# Patient Record
Sex: Male | Born: 1940 | Race: White | Hispanic: No | Marital: Married | Smoking: Never smoker
Health system: Southern US, Community
[De-identification: ages and names within clinical notes are randomized; demographics above are authoritative.]

## PROBLEM LIST (undated history)

## (undated) DIAGNOSIS — N289 Disorder of kidney and ureter, unspecified: Secondary | ICD-10-CM

## (undated) DIAGNOSIS — M109 Gout, unspecified: Secondary | ICD-10-CM

## (undated) DIAGNOSIS — I10 Essential (primary) hypertension: Secondary | ICD-10-CM

---

## 2008-02-08 HISTORY — PX: VASCULAR SURGERY: SHX849

## 2016-11-08 ENCOUNTER — Ambulatory Visit (INDEPENDENT_AMBULATORY_CARE_PROVIDER_SITE_OTHER): Payer: Medicare Other

## 2016-11-08 ENCOUNTER — Ambulatory Visit (INDEPENDENT_AMBULATORY_CARE_PROVIDER_SITE_OTHER): Payer: Medicare Other | Admitting: Orthopaedic Surgery

## 2016-11-08 ENCOUNTER — Encounter (INDEPENDENT_AMBULATORY_CARE_PROVIDER_SITE_OTHER): Payer: Self-pay | Admitting: Orthopaedic Surgery

## 2016-11-08 DIAGNOSIS — M545 Low back pain: Secondary | ICD-10-CM

## 2016-11-08 DIAGNOSIS — G8929 Other chronic pain: Secondary | ICD-10-CM | POA: Diagnosis not present

## 2016-11-08 MED ORDER — PREDNISONE 10 MG (21) PO TBPK
ORAL_TABLET | ORAL | 0 refills | Status: DC
Start: 2016-11-08 — End: 2018-10-08

## 2016-11-08 NOTE — Progress Notes (Signed)
   Office Visit Note   [name]           Date of Birth: [birth year]           MRN: [id number] Visit Date: 11/08/2016              Requested by: No referring provider defined for this encounter. PCP: Patient, No Pcp Per   Assessment & Plan: Visit Diagnoses:  1. Chronic bilateral low back pain, with sciatica presence unspecified     Plan: Overall impression is lumbar radiculopathy. Prednisone taper prescribed. Patient has failed to months of conservative treatment and would recommend MRI of the lumbar spine at this point. Follow-up after the MRI. Total face to face encounter time was greater than 45 minutes and over half of this time was spent in counseling and/or coordination of care.  Follow-Up Instructions: Return in about 2 weeks (around 11/22/2016).   Orders:  Orders Placed This Encounter  Procedures  . XR Lumbar Spine 2-3 Views  . MR Lumbar Spine w/o contrast   Meds ordered this encounter  Medications  . predniSONE (STERAPRED UNI-PAK 21 TAB) 10 MG (21) TBPK tablet    Sig: Take as directed    Dispense:  21 tablet    Refill:  0      Procedures: No procedures performed   Clinical Data: No additional findings.   Subjective: Chief Complaint  Patient presents with  . Lower Back - Pain    76 year old gentleman who comes in with 2 months of left low back and leg radicular pain that has gotten temporary relief from Advil and Aleve. He states the pain radiates all the way down to his foot. Denies any groin pain. His pain is 5 of 10. Denies any bowel or bladder dysfunction    Review of Systems  Constitutional: Negative.   All other systems reviewed and are negative.    Objective: Vital Signs: There were no vitals taken for this visit.  Physical Exam  Constitutional: He is oriented to person, place, and time. He appears well-developed and well-nourished.  HENT:  Head: Normocephalic and atraumatic.  Eyes: Pupils are equal, round, and reactive to  light.  Neck: Neck supple.  Pulmonary/Chest: Effort normal.  Abdominal: Soft.  Musculoskeletal: Normal range of motion.  Neurological: He is alert and oriented to person, place, and time.  Skin: Skin is warm.  Psychiatric: He has a normal mood and affect. His behavior is normal. Judgment and thought content normal.  Nursing note and vitals reviewed.   Ortho Exam Left hip exam is benign. Mildly positive sciatic tension sign. No motor or sensory deficits. Normal reflexes. Specialty Comments:  No specialty comments available.  Imaging: Xr Lumbar Spine 2-3 Views  Result Date: 11/08/2016 Lumbar spondylosis. Hip joints appear to be preserved    PMFS History: There are no active problems to display for this patient.  No past medical history on file.  No family history on file.  No past surgical history on file. Social History   Occupational History  . Not on file.   Social History Main Topics  . Smoking status: Never Smoker  . Smokeless tobacco: Never Used  . Alcohol use Not on file  . Drug use: Unknown  . Sexual activity: Not on file

## 2016-11-17 ENCOUNTER — Telehealth (INDEPENDENT_AMBULATORY_CARE_PROVIDER_SITE_OTHER): Payer: Self-pay | Admitting: Orthopaedic Surgery

## 2016-11-17 NOTE — Telephone Encounter (Signed)
Do you know the status of referral (MRI)

## 2016-11-17 NOTE — Telephone Encounter (Signed)
Pt stated he needed to be scheduled for his MRI and he hasn't heard from anyone.  Kwali [phone]

## 2016-11-18 ENCOUNTER — Telehealth (INDEPENDENT_AMBULATORY_CARE_PROVIDER_SITE_OTHER): Payer: Self-pay | Admitting: Orthopaedic Surgery

## 2016-11-18 NOTE — Telephone Encounter (Signed)
I sw Kay at [facility] imaging and she is suppsoe to call pt to schedule appt

## 2016-11-18 NOTE — Telephone Encounter (Signed)
Patient called this morning stating that he is out of pain medication and the medication Dr. XU prescribeRoda Shuttersd for him really did not work and he is wanting to know if Dr. Roda Shutters can call him in something a little stronger.  He uses [facility] on [address].  CB#[phone].  Thank you.

## 2016-11-18 NOTE — Telephone Encounter (Signed)
Tramadol #[phone]

## 2016-11-18 NOTE — Telephone Encounter (Signed)
Please advise 

## 2016-11-21 MED ORDER — TRAMADOL HCL 50 MG PO TABS: 50.0000 mg | ORAL_TABLET | Freq: Every day | ORAL | 0 refills | Status: DC | PRN

## 2016-11-21 NOTE — Telephone Encounter (Signed)
CALLED INTO PHARM, PT AWARE.

## 2016-11-22 ENCOUNTER — Ambulatory Visit (INDEPENDENT_AMBULATORY_CARE_PROVIDER_SITE_OTHER): Payer: Medicare Other | Admitting: Orthopaedic Surgery

## 2016-11-25 ENCOUNTER — Ambulatory Visit
Admission: RE | Admit: 2016-11-25 | Discharge: 2016-11-25 | Disposition: A | Discharge status: Home / Self Care | Payer: Medicare Other | Source: Ambulatory Visit | Attending: Orthopaedic Surgery | Admitting: Orthopaedic Surgery

## 2016-11-25 DIAGNOSIS — G8929 Other chronic pain: Secondary | ICD-10-CM

## 2016-11-25 DIAGNOSIS — M545 Low back pain: Principal | ICD-10-CM

## 2016-11-28 ENCOUNTER — Ambulatory Visit (INDEPENDENT_AMBULATORY_CARE_PROVIDER_SITE_OTHER): Payer: Medicare Other | Admitting: Orthopaedic Surgery

## 2016-11-28 ENCOUNTER — Encounter (INDEPENDENT_AMBULATORY_CARE_PROVIDER_SITE_OTHER): Payer: Self-pay | Admitting: Orthopaedic Surgery

## 2016-11-28 DIAGNOSIS — M545 Low back pain: Secondary | ICD-10-CM | POA: Diagnosis not present

## 2016-11-28 DIAGNOSIS — G8929 Other chronic pain: Secondary | ICD-10-CM | POA: Diagnosis not present

## 2016-11-28 NOTE — Progress Notes (Signed)
   Office Visit Note   Patient: Eric PutnamRon Hankinson           Date of Birth: 1940-05-21           MRN: [id number] Visit Date: 11/28/2016              Requested by: No referring provider defined for this encounter. PCP: Patient, No Pcp Per   Assessment & Plan: Visit Diagnoses:  1. 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  These findings were discussed with the patient.  We will refer him to Dr. Alvester MorinNewton for epidural steroid injection.  Follow-up as needed.  Follow-Up Instructions: Return if symptoms worsen or fail to improve.   Orders:  Orders Placed This Encounter  Procedures  . Ambulatory referral to Physical Medicine Rehab   No orders of the defined types were placed in this encounter.     Procedures: No procedures performed   Clinical Data: No additional findings.   Subjective: Chief Complaint  Patient presents with  . Lower Back - Pain, Follow-up    Patient follows up today to review his MRI of his he denies any changes in medical history.    Review of Systems   Objective: Vital Signs: There were no vitals taken for this visit.  Physical Exam  Ortho Exam Exam is stable. Specialty Comments:  No specialty comments available.  Imaging: No results found.   PMFS History: There are no active problems to display for this patient.  No past medical history on file.  No family history on file.  No past surgical history on file. Social History   Occupational History  . Not on file.   Social History Main Topics  . Smoking status: Never Smoker  . Smokeless tobacco: Never Used  . Alcohol use Not on file  . Drug use: Unknown  . Sexual activity: Not on file

## 2016-12-07 ENCOUNTER — Ambulatory Visit (INDEPENDENT_AMBULATORY_CARE_PROVIDER_SITE_OTHER): Payer: Medicare Other | Admitting: Physical Medicine and Rehabilitation

## 2016-12-07 ENCOUNTER — Ambulatory Visit (INDEPENDENT_AMBULATORY_CARE_PROVIDER_SITE_OTHER): Payer: Medicare Other

## 2016-12-07 ENCOUNTER — Encounter (INDEPENDENT_AMBULATORY_CARE_PROVIDER_SITE_OTHER): Payer: Self-pay | Admitting: Physical Medicine and Rehabilitation

## 2016-12-07 VITALS — BP 166/69 | HR 61 | Temp 97.8°F

## 2016-12-07 DIAGNOSIS — M5416 Radiculopathy, lumbar region: Secondary | ICD-10-CM

## 2016-12-07 MED ORDER — BETAMETHASONE SOD PHOS & ACET 6 (3-3) MG/ML IJ SUSP
12.0000 mg | Freq: Once | INTRAMUSCULAR | Status: AC
  Administered 2016-12-07: 12 mg

## 2016-12-07 MED ORDER — LIDOCAINE HCL (PF) 1 % IJ SOLN
2.0000 mL | Freq: Once | INTRAMUSCULAR | Status: AC
  Administered 2016-12-07: 2 mL

## 2016-12-07 NOTE — Patient Instructions (Signed)

## 2016-12-07 NOTE — Progress Notes (Deleted)
Pt here for Lumbar spine injection left side. Pain radiates to front of legs but says it changes from time to time. When pt sits down pain starts, if sleep on left side pain is there. Pt is taking ibuprofen and aleve over the counter and works great. No numbness or tingling. 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

## 2016-12-07 NOTE — Procedures (Signed)
Mr. Rolley SimsHampton is a 76 year old gentleman followed by Dr. Roda ShuttersXu for his low back pain and left hip and leg pain.  Patient has failed conservative care as documented.  He is having left-sided low back pain that radiates to the front and side of the leg and more of an L4 and L5 distribution.  He has more pain with sitting than standing and walking.  He has a lot of pain if he sleeps on the left side.  He has been taking ibuprofen and Aleve and that does seem to help.  He denies any tingling or numbness.  MRI shows disc bulge broad with moderate multifactorial stenosis at L4-5 but also with left-sided disc herniation protrusion at L5-S1.  I think his symptoms are a combination of both of these issues.  We will complete a left intralaminar epidural steroid injection.  Lumbar Epidural Steroid Injection - Interlaminar Approach with Fluoroscopic Guidance  Patient: Nobie PutnamRon Mosqueda      Date of Birth: [birth year] MRN: [id number] PCP: Patient, No Pcp Per      Visit Date: 12/07/2016   Universal Protocol:     Consent Given By: the patient  Position: PRONE  Additional Comments: Vital signs were monitored before and after the procedure. Patient was prepped and draped in the usual sterile fashion. The correct patient, procedure, and site was verified.   Injection Procedure Details:  Procedure Site One Meds Administered:  Meds ordered this encounter  Medications  . lidocaine (PF) (XYLOCAINE) 1 % injection 2 mL  . betamethasone acetate-betamethasone sodium phosphate (CELESTONE) injection 12 mg     Laterality: Left  Location/Site:  L5-S1  Needle size: 20 G  Needle type: Tuohy  Needle Placement: Paramedian epidural  Findings:  -Contrast Used: 1 mL iohexol 180 mg iodine/mL   -Comments: Excellent flow of contrast into the epidural space.  Procedure Details: Using a paramedian approach from the side mentioned above, the region overlying the inferior lamina was localized under fluoroscopic visualization  and the soft tissues overlying this structure were infiltrated with 4 ml. of 1% Lidocaine without Epinephrine. The Tuohy needle was inserted into the epidural space using a paramedian approach.   The epidural space was localized using loss of resistance along with lateral and bi-planar fluoroscopic views.  After negative aspirate for air, blood, and CSF, a 2 ml. volume of Isovue-250 was injected into the epidural space and the flow of contrast was observed. Radiographs were obtained for documentation purposes.    The injectate was administered into the level noted above.   Additional Comments:  The patient tolerated the procedure well Dressing: Band-Aid    Post-procedure details: Patient was observed during the procedure. Post-procedure instructions were reviewed.  Patient left the clinic in stable condition.

## 2016-12-15 NOTE — Progress Notes (Signed)
Eric Navarro - 76 y.o. Navarro MRN [id number]  Date of birth: October 15, 1940  Office Visit Note: Visit Date: 12/07/2016 PCP: Patient, No Pcp Per Referred by: No ref. provider found  Subjective: Chief Complaint  Patient presents with  . Lower Back - Pain   HPI: Eric Navarro is a 76 year old Navarro followed by Dr. Roda ShuttersXu for his low back pain and left hip and leg pain.  Patient has failed conservative care as documented.  He is having left-sided low back pain that radiates to the front and side of the leg and more of an L4 and L5 distribution.  He has more pain with sitting than standing and walking.  He has a lot of pain if he sleeps on the left side.  He has been taking ibuprofen and Aleve and that does seem to help.  He denies any tingling or numbness.  Dr. Roda ShuttersXu obtained an MRI of the lumbar spine.  MRI shows disc bulge broad with moderate multifactorial stenosis at L4-5 but also with left-sided disc herniation protrusion at L5-S1.  The patient has had no prior lumbar epidural injections.  He has had no prior back surgery.  He has had a history of some orthopedic issues off and on.  His symptoms have been occurring over the last 3 months without any relief other than over-the-counter anti-inflammatories and those are temporary.  He has not had focused physical therapy.  He did have a prednisone taper without relief.  Medical history is uncomplicated and he really has no significant medical history 76 years old.  Eyes any focal weakness or bowel or bladder symptoms.  This hip pain is worse with side.  Prior history of bursitis.  He denies any groin pain.    Review of Systems  Constitutional: Negative for chills, fever, malaise/fatigue and weight loss.  HENT: Negative for hearing loss and sinus pain.   Eyes: Negative for blurred vision, double vision and photophobia.  Respiratory: Negative for cough and shortness of breath.   Cardiovascular: Negative for chest pain, palpitations and leg swelling.    Gastrointestinal: Negative for abdominal pain, nausea and vomiting.  Genitourinary: Negative for flank pain.  Musculoskeletal: Positive for back pain and joint pain. Negative for myalgias.  Skin: Negative for itching and rash.  Neurological: Negative for tremors, focal weakness and weakness.  Endo/Heme/Allergies: Negative.   Psychiatric/Behavioral: Negative for depression.  All other systems reviewed and are negative.  Otherwise per HPI.  Assessment & Plan: Visit Diagnoses:  1. Lumbar radiculopathy     Plan: Findings:  Chronic worsening back pain with pain pattern into the left hip and leg.  He has multiple issues from an orthopedic and spine standpoint.  I do agree with Dr. Roda ShuttersXu that the majority of his symptoms seem to be radicular and worse with sitting and likely from that small disc herniation at L5-S1.  In that regard I think it would be wise today to complete a diagnostic and hopefully therapeutic left L5-S1 interlaminar epidural steroid injection.  However he also has moderate multifactorial stenosis at L4-5 with facet arthropathy and bony narrowing.  There is no focal compression here this could also cause back pain and leg pain.  Some of his pain probably is related to the facet joints and facet joint block in the future was probably likely he would also benefit from skilled physical therapy at least a short course.  Lastly I think he is having some greater trochanteric bursitis.  He has pain with laying on the left  side.  This could be irritation of the nerve root however we do see that sometimes with pain rotation.  We went over this at length today with the patient and spent more than 20 minutes talking about natural history of disc herniations and injections.    Meds & Orders:  Meds ordered this encounter  Medications  . lidocaine (PF) (XYLOCAINE) 1 % injection 2 mL  . betamethasone acetate-betamethasone sodium phosphate (CELESTONE) injection 12 mg    Orders Placed This  Encounter  Procedures  . XR C-ARM NO REPORT  . Epidural Steroid injection    Follow-up: Return if symptoms worsen or fail to improve.   Procedures: No procedures performed  Eric Navarro followed by Dr. Roda Shutters for his low back pain and left hip and leg pain.  Patient has failed conservative care as documented.  He is having left-sided low back pain that radiates to the front and side of the leg and more of an L4 and L5 distribution.  He has more pain with sitting than standing and walking.  He has a lot of pain if he sleeps on the left side.  He has been taking ibuprofen and Aleve and that does seem to help.  He denies any tingling or numbness.  MRI shows disc bulge broad with moderate multifactorial stenosis at L4-5 but also with left-sided disc herniation protrusion at L5-S1.  I think his symptoms are a combination of both of these issues.  We will complete a left intralaminar epidural steroid injection.  Lumbar Epidural Steroid Injection - Interlaminar Approach with Fluoroscopic Guidance  Eric Navarro      Date of Birth: 1940-09-29 MRN: [id number] PCP: Patient, No Pcp Per      Visit Date: 12/07/2016   Universal Protocol:     Consent Given By: the patient  Position: PRONE  Additional Comments: Vital signs were monitored before and after the procedure. Patient was prepped and draped in the usual sterile fashion. The correct patient, procedure, and site was verified.   Injection Procedure Details:  Procedure Site One Meds Administered:  Meds ordered this encounter  Medications  . lidocaine (PF) (XYLOCAINE) 1 % injection 2 mL  . betamethasone acetate-betamethasone sodium phosphate (CELESTONE) injection 12 mg     Laterality: Left  Location/Site:  L5-S1  Needle size: 20 G  Needle type: Tuohy  Needle Placement: Paramedian epidural  Findings:  -Contrast Used: 1 mL iohexol 180 mg iodine/mL   -Comments: Excellent flow of contrast into the  epidural space.  Procedure Details: Using a paramedian approach from the side mentioned above, the region overlying the inferior lamina was localized under fluoroscopic visualization and the soft tissues overlying this structure were infiltrated with 4 ml. of 1% Lidocaine without Epinephrine. The Tuohy needle was inserted into the epidural space using a paramedian approach.   The epidural space was localized using loss of resistance along with lateral and bi-planar fluoroscopic views.  After negative aspirate for air, blood, and CSF, a 2 ml. volume of Isovue-250 was injected into the epidural space and the flow of contrast was observed. Radiographs were obtained for documentation purposes.    The injectate was administered into the level noted above.   Additional Comments:  The patient tolerated the procedure well Dressing: Band-Aid    Post-procedure details: Patient was observed during the procedure. Post-procedure instructions were reviewed.  Patient left the clinic in stable condition.    Clinical History: MRI LUMBAR SPINE WITHOUT CONTRAST  TECHNIQUE: Multiplanar, multisequence  MR imaging of the lumbar spine was performed. No intravenous contrast was administered.  COMPARISON:  Plain films lumbar spine 11/08/2016.  FINDINGS: Segmentation:  Standard.  Alignment:  Maintained.  Vertebrae: No fracture or worrisome lesion. There is some degenerative endplate signal change posteriorly at L3-4 and L4-5.  Conus medullaris: Extends to the L1 level and appears normal.  Paraspinal and other soft tissues: Negative.  Disc levels:  T11-12 and T12-L1 are imaged in the sagittal plane only. There is a shallow right paracentral protrusion at T11-12. The central canal and foramina appear open at both levels.  L1-2:  Negative.  L2-3: Small protrusion in the far periphery of the right foramen is identified but does not appear to impact the right L2 root. The central  canal and foramina are widely patent.  L3-4: Very shallow disc bulge with endplate spurring mild facet degenerative change. The central canal and foramina are open.  L4-5: Facet arthropathy, ligamentum flavum thickening and a diffuse broad-based disc bulge are identified. There is moderate central canal stenosis. Disc results in narrowing in the subarticular recesses bilaterally with encroachment on the descending L5 roots, worse on the left. The foramina are open.  L5-S1: There is a shallow left lateral recess disc protrusion which contacts and slightly deflects the descending left S1 root. Mild to moderate facet degenerative change is present. The foramina are open.  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 Bilateral subarticular recess narrowing with encroachment on the descending L5 roots at this level is worse on the left.  Small left lateral recess protrusion at L5-S1 slightly deflects the descending left S1 root.  Small protrusion in the far periphery of the right foramen at L2-3 does not appear to impact the exiting right L2 root.   Electronically Signed   By: Drusilla Kannerhomas  Dalessio M.D.   On: 11/25/2016 07:40  He reports that  has never smoked. he has never used smokeless tobacco. No results for input(s): HGBA1C, LABURIC in the last 8760 hours.  Objective:  VS:  HT:    WT:   BMI:     BP:(!) 166/69  HR:61bpm  TEMP:97.8 F (36.6 C)(Oral)  RESP:99 % Physical Exam  Constitutional: He is oriented to person, place, and time. He appears well-developed and well-nourished. No distress.  HENT:  Head: Normocephalic and atraumatic.  Eyes: Conjunctivae are normal. Pupils are equal, round, and reactive to light.  Neck: Normal range of motion. Neck supple.  Cardiovascular: Regular rhythm and intact distal pulses.  Pulmonary/Chest: Effort normal. No respiratory distress.  Musculoskeletal:   Patient ambulates without aid and does have a positive slump test on the left.  He has pain with extension of the lumbar spine and pain with palpation of the left greater trochanter.  He has no pain with hip rotation no groin pain.  He has good distal strength.  Neurological: He is alert and oriented to person, place, and time. He exhibits normal muscle tone. Coordination normal.  Skin: Skin is warm and dry. No rash noted. No erythema.  Psychiatric: He has a normal mood and affect.  Nursing note and vitals reviewed.   Ortho Exam Imaging: No results found.  Past Medical/Family/Surgical/Social History: Medications & Allergies reviewed per EMR There are no active problems to display for this patient.  No past medical history on file. No family history on file. No past surgical history on file. Social History   Occupational History  . Not on file  Tobacco  Use  . Smoking status: Never Smoker  . Smokeless tobacco: Never Used  Substance and Sexual Activity  . Alcohol use: Not on file  . Drug use: Not on file  . Sexual activity: Not on file

## 2017-02-16 DIAGNOSIS — I10 Essential (primary) hypertension: Secondary | ICD-10-CM | POA: Insufficient documentation

## 2017-02-16 DIAGNOSIS — Z9109 Other allergy status, other than to drugs and biological substances: Secondary | ICD-10-CM | POA: Insufficient documentation

## 2017-02-16 DIAGNOSIS — N400 Enlarged prostate without lower urinary tract symptoms: Secondary | ICD-10-CM | POA: Insufficient documentation

## 2018-10-08 ENCOUNTER — Other Ambulatory Visit: Payer: Self-pay

## 2018-10-08 ENCOUNTER — Encounter (HOSPITAL_COMMUNITY): Payer: Self-pay | Admitting: Emergency Medicine

## 2018-10-08 ENCOUNTER — Ambulatory Visit (HOSPITAL_COMMUNITY)
Admission: EM | Admit: 2018-10-08 | Discharge: 2018-10-08 | Disposition: A | Discharge status: Home / Self Care | Payer: Medicare HMO | Attending: Family Medicine | Admitting: Family Medicine

## 2018-10-08 DIAGNOSIS — Z20828 Contact with and (suspected) exposure to other viral communicable diseases: Secondary | ICD-10-CM | POA: Insufficient documentation

## 2018-10-08 DIAGNOSIS — R42 Dizziness and giddiness: Secondary | ICD-10-CM

## 2018-10-08 DIAGNOSIS — I10 Essential (primary) hypertension: Secondary | ICD-10-CM | POA: Diagnosis not present

## 2018-10-08 DIAGNOSIS — R112 Nausea with vomiting, unspecified: Secondary | ICD-10-CM

## 2018-10-08 DIAGNOSIS — Z79899 Other long term (current) drug therapy: Secondary | ICD-10-CM | POA: Insufficient documentation

## 2018-10-08 HISTORY — DX: Essential (primary) hypertension: I10

## 2018-10-08 MED ORDER — ONDANSETRON 4 MG PO TBDP: 4.0000 mg | ORAL_TABLET | Freq: Three times a day (TID) | ORAL | 0 refills | Status: DC | PRN

## 2018-10-08 NOTE — ED Triage Notes (Signed)
Pt sts vomiting and not feeling well with some dizziness starting today; pt sts his wife was tested for covid here today

## 2018-10-08 NOTE — Discharge Instructions (Signed)
COVID pending  Please use zofran as needed for nausea and vomiting- dissolves under tongue Drink plenty of fluids Slowly transition to bland diet Do not make sudden movements  If developing worsening symptoms, abdominal pain, worsening dizziness, unable to keep food/drink down follow up in emergency room

## 2018-10-09 NOTE — ED Provider Notes (Signed)
[facility]    CSN: [id number] Arrival date & time: 10/08/18  1929      History   Chief Complaint Chief Complaint  Patient presents with  . Emesis    HPI Eric Navarro is a 78 y.o. male history of hypertension presenting today for evaluation of nausea, vomiting and dizziness.  Patient states that this morning when he woke up he developed some nausea and vomiting.  He has had some slight dizziness/lightheadedness when changing position and standing up quickly.  Symptoms began today.  Denies any fevers.  Patient states that his daughter and wife recently were here to for COVID testing and their tests are pending.  He has no known exposure to COVID.  Denies any URI symptoms of cough, congestion, sore throat.  Denies abdominal pain.  Denies change in bowel movements and diarrhea.  Denies any chest pain or shortness of breath.  Denies any change in medicines.  Has had decreased oral intake beginning today.  HPI  Past Medical History:  Diagnosis Date  . Hypertension     There are no active problems to display for this patient.   History reviewed. No pertinent surgical history.     Home Medications    Prior to Admission medications   Medication Sig Start Date End Date Taking? Authorizing Provider  DILT-XR 180 MG 24 hr capsule TK 1 C PO D 08/31/16   [provider]  lisinopril (PRINIVIL,ZESTRIL) 10 MG tablet TK 1 T PO D FOR BP 08/30/16   [provider]  ondansetron (ZOFRAN ODT) 4 MG disintegrating tablet Take 1 tablet (4 mg total) by mouth every 8 (eight) hours as needed for nausea or vomiting. 10/08/18   ,  C, PA-C  tamsulosin (FLOMAX) 0.4 MG CAPS capsule TK 1 C PO D 08/30/16   [provider]    Family History History reviewed. No pertinent family history.  Social History Social History   Tobacco Use  . Smoking status: Never Smoker  . Smokeless tobacco: Never Used  Substance Use Topics  . Alcohol use: Not Currently  . Drug  use: Never     Allergies   Patient has no known allergies.   Review of Systems Review of Systems  Constitutional: Negative for activity change, appetite change, chills, fatigue and fever.  HENT: Negative for congestion, ear pain, rhinorrhea, sinus pressure, sore throat and trouble swallowing.   Eyes: Negative for discharge and redness.  Respiratory: Negative for cough, chest tightness and shortness of breath.   Cardiovascular: Negative for chest pain.  Gastrointestinal: Positive for nausea and vomiting. Negative for abdominal pain and diarrhea.  Musculoskeletal: Negative for myalgias.  Skin: Negative for rash.  Neurological: Positive for dizziness and light-headedness. Negative for headaches.     Physical Exam Triage Vital Signs ED Triage Vitals  Enc Vitals Group     BP 10/08/18 1954 (!) 145/70     Pulse Rate 10/08/18 1954 92     Resp 10/08/18 1954 18     Temp 10/08/18 1954 99 F (37.2 C)     Temp Source 10/08/18 1954 Oral     SpO2 10/08/18 1954 98 %     Weight --      Height --      Head Circumference --      Peak Flow --      Pain Score 10/08/18 1955 5     Pain Loc --      Pain Edu? --      Excl. in  GC? --    No data found.  Updated Vital Signs BP (!) 145/70 (BP Location: Right Arm)   Pulse 92   Temp 99 F (37.2 C) (Oral)   Resp 18   SpO2 98%   Visual Acuity Right Eye Distance:   Left Eye Distance:   Bilateral Distance:    Right Eye Near:   Left Eye Near:    Bilateral Near:     Physical Exam Vitals signs and nursing note reviewed.  Constitutional:      Appearance: He is well-developed.  HENT:     Head: Normocephalic and atraumatic.     Ears:     Comments: Bilateral cerumen impaction, difficult to visualize TMs    Mouth/Throat:     Comments: Oral mucosa pink and moist, no tonsillar enlargement or exudate. Posterior pharynx patent and nonerythematous, no uvula deviation or swelling. Normal phonation.  Eyes:     Extraocular Movements:  Extraocular movements intact.     Conjunctiva/sclera: Conjunctivae normal.     Pupils: Pupils are equal, round, and reactive to light.  Neck:     Musculoskeletal: Neck supple.  Cardiovascular:     Rate and Rhythm: Normal rate and regular rhythm.     Heart sounds: No murmur.  Pulmonary:     Effort: Pulmonary effort is normal. No respiratory distress.     Breath sounds: Normal breath sounds.     Comments: Breathing comfortably at rest, CTABL, no wheezing, rales or other adventitious sounds auscultated  Abdominal:     Palpations: Abdomen is soft.     Tenderness: There is no abdominal tenderness.     Comments: Soft, nontender to light deep palpation throughout entire abdomen  Skin:    General: Skin is warm and dry.  Neurological:     General: No focal deficit present.     Mental Status: He is alert and oriented to person, place, and time.     Cranial Nerves: No cranial nerve deficit.     Gait: Gait normal.      UC Treatments / Results  Labs (all labs ordered are listed, but only abnormal results are displayed) Labs Reviewed  NOVEL CORONAVIRUS, NAA (HOSP ORDER, SEND-OUT TO REF LAB; TAT 18-24 HRS)    EKG   Radiology No results found.  Procedures Procedures (including critical care time)  Medications Ordered in UC Medications - No data to display  Initial Impression / Assessment and Plan / UC Course  I have reviewed the triage vital signs and the nursing notes.  Pertinent labs & imaging results that were available during my care of the patient were reviewed by me and considered in my medical decision making (see chart for details).     Patient with 1 day of nausea and vomiting without abdominal pain.  Vital signs stable.  Do not suspect underlying abdominal emergency at this time.  Has had some dizziness with changing position, likely from mild dehydration.  Will provide Zofran to use as needed for nausea and vomiting, trial of oral rehydration, push fluids.  COVID swab  pending, currently without URI symptoms.Discussed strict return precautions. Patient verbalized understanding and is agreeable with plan.  Final Clinical Impressions(s) / UC Diagnoses   Final diagnoses:  Nausea and vomiting, intractability of vomiting not specified, unspecified vomiting type  Dizziness     Discharge Instructions     COVID pending  Please use zofran as needed for nausea and vomiting- dissolves under tongue Drink plenty of fluids Slowly transition to bland diet  Do not make sudden movements  If developing worsening symptoms, abdominal pain, worsening dizziness, unable to keep food/drink down follow up in emergency room   ED Prescriptions    Medication Sig Dispense Auth. Provider   ondansetron (ZOFRAN ODT) 4 MG disintegrating tablet Take 1 tablet (4 mg total) by mouth every 8 (eight) hours as needed for nausea or vomiting. 20 tablet , [place] C, PA-C     Controlled Substance Prescriptions [place] Controlled Substance Registry consulted? Not Applicable   Janith Lima, [place] 10/09/18 5825

## 2018-10-10 LAB — NOVEL CORONAVIRUS, NAA (HOSP ORDER, SEND-OUT TO REF LAB; TAT 18-24 HRS): SARS-CoV-2, NAA: NOT DETECTED

## 2018-10-11 ENCOUNTER — Encounter (HOSPITAL_COMMUNITY): Payer: Self-pay

## 2018-12-28 ENCOUNTER — Encounter (HOSPITAL_COMMUNITY): Payer: Self-pay

## 2018-12-28 ENCOUNTER — Other Ambulatory Visit: Payer: Self-pay

## 2018-12-28 ENCOUNTER — Ambulatory Visit (HOSPITAL_COMMUNITY)
Admission: EM | Admit: 2018-12-28 | Discharge: 2018-12-28 | Disposition: A | Discharge status: Home / Self Care | Payer: Medicare HMO | Attending: Emergency Medicine | Admitting: Emergency Medicine

## 2018-12-28 DIAGNOSIS — M109 Gout, unspecified: Secondary | ICD-10-CM

## 2018-12-28 HISTORY — DX: Gout, unspecified: M10.9

## 2018-12-28 MED ORDER — PREDNISONE 10 MG (21) PO TBPK: ORAL_TABLET | Freq: Every day | ORAL | 0 refills | Status: DC

## 2018-12-28 NOTE — ED Provider Notes (Signed)
[facility]    CSN: [id number] Arrival date & time: 12/28/18  0915      History   Chief Complaint Chief Complaint  Patient presents with  . Gout    HPI Eric Navarro is a 78 y.o. male.   Eric Navarro presents with complaints of pain and swelling to left foot big toe. Started approximately 4 days ago, no known injury. He is on his feet a lot. Has applied ice which does help some. Hasn't taken any medications for pain. Pain even at rest, kept him up last night. No fevers. He drinks wine occasionally, drinks water regularly. No other obvious diet change recently. Has had gout before but it was many years ago and he doesn't recall if this felt similar. Also states that skin to left lower leg is itchy. Has been applying some lotion which hasn't necessarily helped. History  Of htn.    ROS per HPI, negative if not otherwise mentioned.      Past Medical History:  Diagnosis Date  . Gout   . Hypertension     There are no active problems to display for this patient.   History reviewed. No pertinent surgical history.     Home Medications    Prior to Admission medications   Medication Sig Start Date End Date Taking? Authorizing Provider  DILT-XR 180 MG 24 hr capsule TK 1 C PO D 08/31/16   [provider]  lisinopril (PRINIVIL,ZESTRIL) 10 MG tablet TK 1 T PO D FOR BP 08/30/16   [provider]  ondansetron (ZOFRAN ODT) 4 MG disintegrating tablet Take 1 tablet (4 mg total) by mouth every 8 (eight) hours as needed for nausea or vomiting. 10/08/18   Wieters, Hallie C, PA-C  predniSONE (STERAPRED UNI-PAK 21 TAB) 10 MG (21) TBPK tablet Take by mouth daily. Per box instruction 12/28/18   Zigmund Gottron, NP  tamsulosin (FLOMAX) 0.4 MG CAPS capsule TK 1 C PO D 08/30/16   [provider]    Family History Family History  Family history unknown: Yes    Social History Social History   Tobacco Use  . Smoking status: Never Smoker  . Smokeless  tobacco: Never Used  Substance Use Topics  . Alcohol use: Not Currently  . Drug use: Never     Allergies   Patient has no known allergies.   Review of Systems Review of Systems   Physical Exam Triage Vital Signs ED Triage Vitals  Enc Vitals Group     BP 12/28/18 1025 (!) 141/76     Pulse Rate 12/28/18 1025 60     Resp 12/28/18 1025 17     Temp 12/28/18 1025 97.9 F (36.6 C)     Temp Source 12/28/18 1025 Oral     SpO2 12/28/18 1025 100 %     Weight --      Height --      Head Circumference --      Peak Flow --      Pain Score 12/28/18 1026 7     Pain Loc --      Pain Edu? --      Excl. in [place]? --    No data found.  Updated Vital Signs BP (!) 141/76 (BP Location: Right Arm)   Pulse 60   Temp 97.9 F (36.6 C) (Oral)   Resp 17   SpO2 100%    Physical Exam Constitutional:      Appearance: He is well-developed.  Cardiovascular:  Rate and Rhythm: Normal rate.  Pulmonary:     Effort: Pulmonary effort is normal.  Musculoskeletal:     Left foot: Tenderness, bony tenderness and swelling present.     Comments: Left foot great toe MCP joint with redness, swelling and tenderness on palpation; cap refill < 2 seconds ; left lower leg with some dry skin with irritation noted from patient scratching at the skin without any obvious rash visible    Skin:    General: Skin is warm and dry.  Neurological:     Mental Status: He is alert and oriented to person, place, and time.      UC Treatments / Results  Labs (all labs ordered are listed, but only abnormal results are displayed) Labs Reviewed - No data to display  EKG   Radiology No results found.  Procedures Procedures (including critical care time)  Medications Ordered in UC Medications - No data to display  Initial Impression / Assessment and Plan / UC Course  I have reviewed the triage vital signs and the nursing notes.  Pertinent labs & imaging results that were available during my care of the  patient were reviewed by me and considered in my medical decision making (see chart for details).     Findings are consistent with gout at this time, with prednisone provided. No obvious rash to lower leg, dry skin treatment recommendations discussed and provided. Encouraged establish and follow with pcp for bp recheck. Patient verbalized understanding and agreeable to plan.  Ambulatory out of clinic without difficulty.    Final Clinical Impressions(s) / UC Diagnoses   Final diagnoses:  Acute gout involving toe of left foot, unspecified cause     Discharge Instructions     Ice, elevation to help with pain.  Prednisone pack as prescribed to help with gout flare.  See diet recommendations for prevention of flare, drink plenty of water.  Use of a thick cream to lower leg dryness, such as Aquaphor may be helpful.  Please establish with a primary care provider for recheck and management as needed.    ED Prescriptions    Medication Sig Dispense Auth. Provider   predniSONE (STERAPRED UNI-PAK 21 TAB) 10 MG (21) TBPK tablet Take by mouth daily. Per box instruction 21 tablet Georgetta Haber, NP     PDMP not reviewed this encounter.   Georgetta Haber, NP 12/28/18 1142

## 2018-12-28 NOTE — ED Triage Notes (Signed)
Pt states he is having a recurring gout flare up in left foot.

## 2018-12-28 NOTE — Discharge Instructions (Signed)
Ice, elevation to help with pain.  Prednisone pack as prescribed to help with gout flare.  See diet recommendations for prevention of flare, drink plenty of water.  Use of a thick cream to lower leg dryness, such as Aquaphor may be helpful.  Please establish with a primary care provider for recheck and management as needed.

## 2019-02-18 ENCOUNTER — Ambulatory Visit: Payer: Medicare Other | Attending: Internal Medicine

## 2019-02-18 DIAGNOSIS — Z23 Encounter for immunization: Secondary | ICD-10-CM | POA: Insufficient documentation

## 2019-02-18 NOTE — Progress Notes (Signed)
   Covid-19 Vaccination Clinic  Name:  Eric Navarro    MRN: [id number] DOB: 1940-04-10  02/18/2019  Eric Navarro was observed post Covid-19 immunization for 15 minutes without incidence. He was provided with Vaccine Information Sheet and instruction to access the V-Safe system.   Eric Navarro was instructed to call 911 with any severe reactions post vaccine: Marland Kitchen Difficulty breathing  . Swelling of your face and throat  . A fast heartbeat  . A bad rash all over your body  . Dizziness and weakness    Immunizations Administered    Name Date Dose VIS Date Route   Pfizer COVID-19 Vaccine 02/18/2019  8:56 AM 0.3 mL 01/18/2019 Intramuscular   Manufacturer: [employer], [employer]   Lot: [id number]   NDC: [address]-3

## 2019-03-10 ENCOUNTER — Ambulatory Visit: Payer: Medicare HMO | Attending: Internal Medicine

## 2019-03-10 DIAGNOSIS — Z23 Encounter for immunization: Secondary | ICD-10-CM | POA: Insufficient documentation

## 2019-03-10 NOTE — Progress Notes (Signed)
   Covid-19 Vaccination Clinic  Name:  Eric Navarro    MRN: [id number] DOB: 02-Jan-1941  03/10/2019  [name] was observed post Covid-19 immunization for 15 minutes without incidence. He was provided with Vaccine Information Sheet and instruction to access the V-Safe system.   [name] was instructed to call 911 with any severe reactions post vaccine: Marland Kitchen Difficulty breathing  . Swelling of your face and throat  . A fast heartbeat  . A bad rash all over your body  . Dizziness and weakness    Immunizations Administered    Name Date Dose VIS Date Route   Pfizer COVID-19 Vaccine 03/10/2019  9:12 AM 0.3 mL 01/18/2019 Intramuscular   Manufacturer: [employer], [employer]   Lot: [id number]   NDC: [address]-9

## 2019-08-05 ENCOUNTER — Other Ambulatory Visit: Payer: Self-pay

## 2019-08-05 ENCOUNTER — Ambulatory Visit (HOSPITAL_COMMUNITY)
Admission: EM | Admit: 2019-08-05 | Discharge: 2019-08-05 | Disposition: A | Discharge status: Home / Self Care | Payer: Medicare HMO | Attending: Physician Assistant | Admitting: Physician Assistant

## 2019-08-05 ENCOUNTER — Encounter (HOSPITAL_COMMUNITY): Payer: Self-pay | Admitting: Emergency Medicine

## 2019-08-05 DIAGNOSIS — R1032 Left lower quadrant pain: Secondary | ICD-10-CM

## 2019-08-05 LAB — POCT URINALYSIS DIP (DEVICE)
Bilirubin Urine: NEGATIVE
Glucose, UA: NEGATIVE mg/dL
Hgb urine dipstick: NEGATIVE
Ketones, ur: NEGATIVE mg/dL
Nitrite: NEGATIVE
Protein, ur: NEGATIVE mg/dL
Specific Gravity, Urine: >=1.03 (ref 1.005–1.030)
Urobilinogen, UA: 0.2 mg/dL (ref 0.0–1.0)
pH: 5 (ref 5.0–8.0)

## 2019-08-05 NOTE — Discharge Instructions (Addendum)
We believe this may be related to veins in your spermatic chord have some varicosity.  The urinalysis showed some white blood cells, we will send a culture to decided on any treatments  Wear supportive underwear while up and about  Follow up with your PCP   If not improving, I have supplied a urology follow up option as well   If you were to have any worsening light headedness and feel like you might pass out, go to the Emergency department

## 2019-08-05 NOTE — ED Provider Notes (Signed)
[facility]    CSN: [id number] Arrival date & time: 08/05/19  1653      History   Chief Complaint Chief Complaint  Patient presents with  . Groin Pain    HPI Eric Navarro is a 79 y.o. male.   Patient reports urgent care for left-sided groin pain.  He reports pain has been present for the last 3 days.  He reports started with a twinge when he is walking his dog.  Since then pain is increased to a stronger pain sensation.  He reports is primarily there when standing and walking.  He reports this feels like a pulling aching sensation.  Starts in his lower abdomen and radiates down to the left aspect of his scrotum.  He denies pain when seated or resting.  Denies any scrotal swelling.  Denies any rashes.  Moving his bowels normally without diarrhea or constipation.  No painful urination, frequency or urgency.  He does report having episode of lightheaded feeling last week that went away quickly.  He reports an episode today when he experienced the pain while walking.  He reports this also went away after he sat down and rested has not had this since.  Denies lightheaded feeling clinic.  Denies chest pain or shortness of breath with any of these episodes.  At no point during these episodes that he feel he was going to pass out.  He reports he has been drinking water and eating normally.  Is not any nausea or vomiting.     Past Medical History:  Diagnosis Date  . Gout   . Hypertension     There are no problems to display for this patient.   History reviewed. No pertinent surgical history.     Home Medications    Prior to Admission medications   Medication Sig Start Date End Date Taking? Authorizing Provider  diclofenac (VOLTAREN) 75 MG EC tablet Take 75 mg by mouth 2 (two) times daily. 07/26/19  Yes [provider]  lisinopril (PRINIVIL,ZESTRIL) 10 MG tablet TK 1 T PO D FOR BP 08/30/16  Yes [provider]  montelukast (SINGULAIR) 10 MG tablet Take by  mouth. 07/26/19  Yes [provider]  tamsulosin (FLOMAX) 0.4 MG CAPS capsule TK 1 C PO D 08/30/16  Yes [provider]  colchicine 0.6 MG tablet Take by mouth as directed. 04/30/19   [provider]  DILT-XR 180 MG 24 hr capsule TK 1 C PO D 08/31/16   [provider]  ondansetron (ZOFRAN ODT) 4 MG disintegrating tablet Take 1 tablet (4 mg total) by mouth every 8 (eight) hours as needed for nausea or vomiting. 10/08/18   Wieters, Hallie C, PA-C  predniSONE (STERAPRED UNI-PAK 21 TAB) 10 MG (21) TBPK tablet Take by mouth daily. Per box instruction 12/28/18   Georgetta Haber, NP    Family History Family History  Family history unknown: Yes    Social History Social History   Tobacco Use  . Smoking status: Never Smoker  . Smokeless tobacco: Never Used  Substance Use Topics  . Alcohol use: Not Currently  . Drug use: Never     Allergies   [place] nut (berthollefia excelsa) skin test   Review of Systems Review of Systems   Physical Exam Triage Vital Signs ED Triage Vitals [08/05/19 1815]  Enc Vitals Group     BP      Pulse      Resp      Temp  Temp src      SpO2      Weight      Height      Head Circumference      Peak Flow      Pain Score 6     Pain Loc      Pain Edu?      Excl. in [place]?    No data found.  Updated Vital Signs BP (S) (!) 157/76 (BP Location: Left Arm) Comment: re-eval  Pulse (!) 53   Temp 97.9 F (36.6 C) (Oral)   Resp 18   SpO2 97%   Visual Acuity Right Eye Distance:   Left Eye Distance:   Bilateral Distance:    Right Eye Near:   Left Eye Near:    Bilateral Near:     Physical Exam Vitals and nursing note reviewed.  Constitutional:      General: He is not in acute distress.    Appearance: Normal appearance. He is well-developed. He is not ill-appearing.  HENT:     Head: Normocephalic and atraumatic.  Eyes:     Conjunctiva/sclera: Conjunctivae normal.  Cardiovascular:     Rate and Rhythm: Normal  rate and regular rhythm.     Heart sounds: No murmur heard.   Pulmonary:     Effort: Pulmonary effort is normal. No respiratory distress.     Breath sounds: Normal breath sounds.  Abdominal:     Palpations: Abdomen is soft.     Tenderness: There is no abdominal tenderness.  Genitourinary:    Comments: Testicles with equal lie and scrotum.  No tenderness, masses or swelling of the scrotum or testicles.  Cremasteric reflex intact.  There is a mild firmness and swelling in the proximal spermatic cord.  No appreciable hernia and canal.  No inguinal hernia.  No inguinal lymphadenopathy.  There is no inguinal swelling. Musculoskeletal:     Cervical back: Neck supple.     Right lower leg: No edema.     Left lower leg: No edema.  Skin:    General: Skin is warm and dry.  Neurological:     Mental Status: He is alert.      UC Treatments / Results  Labs (all labs ordered are listed, but only abnormal results are displayed) Labs Reviewed  POCT URINALYSIS DIP (DEVICE) - Abnormal; Notable for the following components:      Result Value   Leukocytes,Ua SMALL (*)    All other components within normal limits  URINE CULTURE    EKG   Radiology No results found.  Procedures Procedures (including critical care time)  Medications Ordered in UC Medications - No data to display  Initial Impression / Assessment and Plan / UC Course  I have reviewed the triage vital signs and the nursing notes.  Pertinent labs & imaging results that were available during my care of the patient were reviewed by me and considered in my medical decision making (see chart for details).     #Left inguinal pain Patient is a 79 year old gentleman with left inguinal pain.  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  Also possible reducible hernia not felt on exam.  You obtained as patient does have a history of renal stone, no blood with small leuks.  Culture sent, low suspicion for infection.   Discussed with patient that wearing of supportive underwear should aid in symptoms.  Discussed that he should follow with his primary care, I also supplied a urology follow-up with this is becoming  more bothersome.  Emergency department precautions were discussed.  Patient verbalized understanding plan of care. -Case discussed with supervising physician Dr. Lanny Cramp Final Clinical Impressions(s) / UC Diagnoses   Final diagnoses:  Left inguinal pain     Discharge Instructions     We believe this may be related to veins in your spermatic chord have some varicosity.  The urinalysis showed some white blood cells, we will send a culture to decided on any treatments  Wear supportive underwear while up and about  Follow up with your PCP   If not improving, I have supplied a urology follow up option as well   If you were to have any worsening light headedness and feel like you might pass out, go to the Emergency department       ED Prescriptions    None     PDMP not reviewed this encounter.   Purnell Shoemaker, PA-C 08/06/19 [phone]

## 2019-08-05 NOTE — ED Triage Notes (Signed)
Pt c/o of left groin pain after walking dog on Friday. Pain radiates down to testicular area on left side only while walking dog on Friday. States no pain at sitting or at rest. Dizziness began on Friday while walking.   Pt complains of no NVD.  Fluid intake good. No c/o chest pain, SOB or weakness. No dysuria symptoms. No c/o of dizziness at moment. 1 week ago pt had ear lavage.

## 2019-08-07 LAB — URINE CULTURE: Culture: <10000 — AB

## 2020-01-30 ENCOUNTER — Other Ambulatory Visit: Payer: Medicare HMO

## 2020-01-30 DIAGNOSIS — Z20822 Contact with and (suspected) exposure to covid-19: Secondary | ICD-10-CM

## 2020-02-02 LAB — NOVEL CORONAVIRUS, NAA: SARS-CoV-2, NAA: NOT DETECTED

## 2020-06-03 ENCOUNTER — Encounter (HOSPITAL_BASED_OUTPATIENT_CLINIC_OR_DEPARTMENT_OTHER): Payer: Self-pay | Admitting: Emergency Medicine

## 2020-06-03 ENCOUNTER — Emergency Department (HOSPITAL_BASED_OUTPATIENT_CLINIC_OR_DEPARTMENT_OTHER)
Admission: EM | Admit: 2020-06-03 | Discharge: 2020-06-03 | Disposition: A | Discharge status: Home / Self Care | Payer: Medicare HMO | Attending: Emergency Medicine | Admitting: Emergency Medicine

## 2020-06-03 ENCOUNTER — Other Ambulatory Visit: Payer: Self-pay

## 2020-06-03 ENCOUNTER — Emergency Department (HOSPITAL_BASED_OUTPATIENT_CLINIC_OR_DEPARTMENT_OTHER): Payer: Medicare HMO

## 2020-06-03 DIAGNOSIS — I10 Essential (primary) hypertension: Secondary | ICD-10-CM | POA: Insufficient documentation

## 2020-06-03 DIAGNOSIS — Z20822 Contact with and (suspected) exposure to covid-19: Secondary | ICD-10-CM | POA: Insufficient documentation

## 2020-06-03 DIAGNOSIS — R112 Nausea with vomiting, unspecified: Secondary | ICD-10-CM

## 2020-06-03 DIAGNOSIS — R001 Bradycardia, unspecified: Secondary | ICD-10-CM | POA: Diagnosis not present

## 2020-06-03 DIAGNOSIS — Z79899 Other long term (current) drug therapy: Secondary | ICD-10-CM | POA: Insufficient documentation

## 2020-06-03 DIAGNOSIS — N200 Calculus of kidney: Secondary | ICD-10-CM | POA: Insufficient documentation

## 2020-06-03 DIAGNOSIS — R1031 Right lower quadrant pain: Secondary | ICD-10-CM

## 2020-06-03 HISTORY — DX: Disorder of kidney and ureter, unspecified: N28.9

## 2020-06-03 LAB — URINALYSIS, ROUTINE W REFLEX MICROSCOPIC
Bilirubin Urine: NEGATIVE
Glucose, UA: NEGATIVE mg/dL
Hgb urine dipstick: NEGATIVE
Ketones, ur: NEGATIVE mg/dL
Nitrite: NEGATIVE
Protein, ur: NEGATIVE mg/dL
Specific Gravity, Urine: 1.027 (ref 1.005–1.030)
pH: 5 (ref 5.0–8.0)

## 2020-06-03 LAB — CBC WITH DIFFERENTIAL/PLATELET
Abs Immature Granulocytes: 0.02 10*3/uL (ref 0.00–0.07)
Basophils Absolute: 0 10*3/uL (ref 0.0–0.1)
Basophils Relative: 0 %
Eosinophils Absolute: 0 10*3/uL (ref 0.0–0.5)
Eosinophils Relative: 0 %
HCT: 44.3 % (ref 39.0–52.0)
Hemoglobin: 15.3 g/dL (ref 13.0–17.0)
Immature Granulocytes: 0 %
Lymphocytes Relative: 11 %
Lymphs Abs: 1.1 10*3/uL (ref 0.7–4.0)
MCH: 30.5 pg (ref 26.0–34.0)
MCHC: 34.5 g/dL (ref 30.0–36.0)
MCV: 88.4 fL (ref 80.0–100.0)
Monocytes Absolute: 0.4 10*3/uL (ref 0.1–1.0)
Monocytes Relative: 4 %
Neutro Abs: 8.3 10*3/uL — ABNORMAL HIGH (ref 1.7–7.7)
Neutrophils Relative %: 85 %
Platelets: 196 10*3/uL (ref 150–400)
RBC: 5.01 MIL/uL (ref 4.22–5.81)
RDW: 12.9 % (ref 11.5–15.5)
WBC: 9.8 10*3/uL (ref 4.0–10.5)
nRBC: 0 % (ref 0.0–0.2)

## 2020-06-03 LAB — COMPREHENSIVE METABOLIC PANEL
ALT: 18 U/L (ref 0–44)
AST: 18 U/L (ref 15–41)
Albumin: 4.5 g/dL (ref 3.5–5.0)
Alkaline Phosphatase: 53 U/L (ref 38–126)
Anion gap: 11 (ref 5–15)
BUN: 27 mg/dL — ABNORMAL HIGH (ref 8–23)
CO2: 22 mmol/L (ref 22–32)
Calcium: 9.9 mg/dL (ref 8.9–10.3)
Chloride: 104 mmol/L (ref 98–111)
Creatinine, Ser: 1.37 mg/dL — ABNORMAL HIGH (ref 0.61–1.24)
GFR, Estimated: 53 mL/min — ABNORMAL LOW (ref >60)
Glucose, Bld: 148 mg/dL — ABNORMAL HIGH (ref 70–99)
Potassium: 4.5 mmol/L (ref 3.5–5.1)
Sodium: 137 mmol/L (ref 135–145)
Total Bilirubin: 0.5 mg/dL (ref 0.3–1.2)
Total Protein: 7.4 g/dL (ref 6.5–8.1)

## 2020-06-03 LAB — RESP PANEL BY RT-PCR (FLU A&B, COVID) ARPGX2
Influenza A by PCR: NEGATIVE
Influenza B by PCR: NEGATIVE
SARS Coronavirus 2 by RT PCR: NEGATIVE

## 2020-06-03 LAB — TSH: TSH: 4.992 u[IU]/mL — ABNORMAL HIGH (ref 0.350–4.500)

## 2020-06-03 LAB — LIPASE, BLOOD: Lipase: 24 U/L (ref 11–51)

## 2020-06-03 LAB — MAGNESIUM: Magnesium: 1.9 mg/dL (ref 1.7–2.4)

## 2020-06-03 LAB — LACTIC ACID, PLASMA: Lactic Acid, Venous: 2.5 mmol/L (ref 0.5–1.9)

## 2020-06-03 MED ORDER — MORPHINE SULFATE (PF) 4 MG/ML IV SOLN
4.0000 mg | Freq: Once | INTRAVENOUS | Status: AC
  Administered 2020-06-03: 4 mg via INTRAVENOUS
  Filled 2020-06-03: qty 1

## 2020-06-03 MED ORDER — IOHEXOL 300 MG/ML  SOLN
80.0000 mL | Freq: Once | INTRAMUSCULAR | Status: AC | PRN
  Administered 2020-06-03: 80 mL via INTRAVENOUS

## 2020-06-03 MED ORDER — SODIUM CHLORIDE 0.9 % IV BOLUS
1000.0000 mL | Freq: Once | INTRAVENOUS | Status: AC
  Administered 2020-06-03: 1000 mL via INTRAVENOUS

## 2020-06-03 MED ORDER — ONDANSETRON HCL 4 MG PO TABS
4.0000 mg | ORAL_TABLET | Freq: Three times a day (TID) | ORAL | 0 refills | Status: DC | PRN
Start: 2020-06-03 — End: 2020-08-13

## 2020-06-03 MED ORDER — OXYCODONE-ACETAMINOPHEN 5-325 MG PO TABS: 1.0000 | ORAL_TABLET | ORAL | 0 refills | Status: DC | PRN

## 2020-06-03 MED ORDER — ONDANSETRON HCL 4 MG/2ML IJ SOLN
4.0000 mg | Freq: Once | INTRAMUSCULAR | Status: AC
  Administered 2020-06-03: 4 mg via INTRAVENOUS
  Filled 2020-06-03: qty 2

## 2020-06-03 NOTE — ED Triage Notes (Addendum)
Sudden onset rt sided abd pain had some vomiting no distention  Some tenderness

## 2020-06-03 NOTE — Discharge Instructions (Signed)
Your work-up today confirmed a kidney stone is the likely cause of the right-sided abdominal discomfort.  The urinalysis did not show convincing evidence of infection however a culture was sent.  If it does show infection, you will be called.  Please follow-up with your urologist for further management of the kidney stone   If symptoms persist.  Please follow-up with your primary doctor for further evaluation of that abnormal finding near your stomach on the CT scan as well as the thyroid level with your slow heart rate.  Please rest and stay hydrated.  If any symptoms change or worsen acutely, please return to the nearest emergency department.

## 2020-06-03 NOTE — ED Notes (Signed)
CRITICAL VALUE STICKER  CRITICAL VALUE:Lactic acid 2.4  RECEIVER (on-site recipient of call):Carmon Ginsberg, RN  DATE & TIME NOTIFIED: 06-03-2020 [phone]  MESSENGER (representative from lab):  MD NOTIFIED: Dr. Rush Landmark  TIME OF NOTIFICATION:0925  RESPONSE:

## 2020-06-03 NOTE — ED Provider Notes (Signed)
[facility] EMERGENCY DEPT Provider Note   CSN: [id number] Arrival date & time: 06/03/20  1540     History Chief Complaint  Patient presents with  . Abdominal Pain    Eric Navarro is a 80 y.o. male.  The history is provided by medical records, the patient and the EMS personnel. No language interpreter was used.  Abdominal Pain Pain location:  RLQ and RUQ Pain quality: aching   Pain radiates to:  Does not radiate Pain severity:  Severe Onset quality:  Sudden Duration:  4 hours Timing:  Constant Progression:  Unchanged Chronicity:  New Context: not previous surgeries   Relieved by:  Nothing Worsened by:  Nothing Ineffective treatments:  None tried Associated symptoms: nausea and vomiting   Associated symptoms: no chest pain, no chills, no constipation, no cough, no diarrhea, no dysuria, no fatigue, no fever, no hematemesis, no hematochezia, no hematuria and no shortness of breath        Past Medical History:  Diagnosis Date  . Gout   . Hypertension   . Renal disorder    kidney stone    There are no problems to display for this patient.   No past surgical history on file.     Family History  Family history unknown: Yes    Social History   Tobacco Use  . Smoking status: Never Smoker  . Smokeless tobacco: Never Used  Substance Use Topics  . Alcohol use: Not Currently  . Drug use: Never    Home Medications Prior to Admission medications   Medication Sig Start Date End Date Taking? Authorizing Provider  colchicine 0.6 MG tablet Take by mouth as directed. 04/30/19   [provider]  diclofenac (VOLTAREN) 75 MG EC tablet Take 75 mg by mouth 2 (two) times daily. 07/26/19   [provider]  DILT-XR 180 MG 24 hr capsule TK 1 C PO D 08/31/16   [provider]  lisinopril (PRINIVIL,ZESTRIL) 10 MG tablet TK 1 T PO D FOR BP 08/30/16   [provider]  montelukast (SINGULAIR) 10 MG tablet Take by mouth. 07/26/19    [provider]  ondansetron (ZOFRAN ODT) 4 MG disintegrating tablet Take 1 tablet (4 mg total) by mouth every 8 (eight) hours as needed for nausea or vomiting. 10/08/18   Wieters, Hallie C, PA-C  predniSONE (STERAPRED UNI-PAK 21 TAB) 10 MG (21) TBPK tablet Take by mouth daily. Per box instruction 12/28/18   Georgetta Haber, NP  tamsulosin (FLOMAX) 0.4 MG CAPS capsule TK 1 C PO D 08/30/16   [provider]    Allergies    [place] nut (berthollefia excelsa) skin test  Review of Systems   Review of Systems  Constitutional: Positive for diaphoresis (reported). Negative for chills, fatigue and fever.  HENT: Negative for congestion. Dental problem:     Eyes: Negative for visual disturbance.  Respiratory: Negative for cough, chest tightness and shortness of breath.   Cardiovascular: Positive for leg swelling (mild bilateral on my exam). Negative for chest pain and palpitations.  Gastrointestinal: Positive for abdominal pain, nausea and vomiting. Negative for constipation, diarrhea, hematemesis and hematochezia.  Genitourinary: Negative for decreased urine volume, dysuria, flank pain, frequency, hematuria, penile discharge, penile pain, penile swelling, scrotal swelling and testicular pain.  Musculoskeletal: Negative for back pain, neck pain and neck stiffness.  Skin: Negative for wound.  Neurological: Negative for dizziness, weakness, light-headedness, numbness and headaches.  Psychiatric/Behavioral: Negative for agitation and confusion.  All other systems reviewed and  are negative.   Physical Exam Updated Vital Signs BP (!) 136/59 (BP Location: Right Arm)   Pulse (!) 47   Temp 97.7 F (36.5 C) (Oral)   Resp 20   Ht  (1.676 m)   Wt 106.6 kg   SpO2 100%   BMI 37.93 kg/m   Physical Exam Vitals and nursing note reviewed.  Constitutional:      General: He is not in acute distress.    Appearance: He is well-developed. He is not ill-appearing, toxic-appearing or  diaphoretic.  HENT:     Head: Normocephalic and atraumatic.  Eyes:     Extraocular Movements: Extraocular movements intact.     Conjunctiva/sclera: Conjunctivae normal.     Pupils: Pupils are equal, round, and reactive to light.  Cardiovascular:     Rate and Rhythm: Regular rhythm. Bradycardia present.     Heart sounds: Normal heart sounds. No murmur heard.   Pulmonary:     Effort: Pulmonary effort is normal. No respiratory distress.     Breath sounds: Normal breath sounds. No wheezing, rhonchi or rales.  Chest:     Chest wall: No tenderness.  Abdominal:     General: Abdomen is flat. Bowel sounds are normal. There is no distension.     Palpations: Abdomen is soft.     Tenderness: There is abdominal tenderness in the right upper quadrant and right lower quadrant. There is no right CVA tenderness, left CVA tenderness, guarding or rebound.  Musculoskeletal:     Cervical back: Neck supple.  Skin:    General: Skin is warm and dry.     Capillary Refill: Capillary refill takes less than 2 seconds.     Coloration: Skin is not pale.     Findings: No rash.  Neurological:     General: No focal deficit present.     Mental Status: He is alert.  Psychiatric:        Mood and Affect: Mood normal.     ED Results / Procedures / Treatments   Labs (all labs ordered are listed, but only abnormal results are displayed) Labs Reviewed  CBC WITH DIFFERENTIAL/PLATELET - Abnormal; Notable for the following components:      Result Value   Neutro Abs 8.3 (*)    All other components within normal limits  COMPREHENSIVE METABOLIC PANEL - Abnormal; Notable for the following components:   Glucose, Bld 148 (*)    BUN 27 (*)    Creatinine, Ser 1.37 (*)    GFR, Estimated 53 (*)    All other components within normal limits  LACTIC ACID, PLASMA - Abnormal; Notable for the following components:   Lactic Acid, Venous 2.5 (*)    All other components within normal limits  URINALYSIS, ROUTINE W REFLEX  MICROSCOPIC - Abnormal; Notable for the following components:   Leukocytes,Ua SMALL (*)    Bacteria, UA RARE (*)    All other components within normal limits  RESP PANEL BY RT-PCR (FLU A&B, COVID) [id number]  URINE CULTURE  LIPASE, BLOOD  MAGNESIUM  LACTIC ACID, PLASMA  TSH    EKG EKG Interpretation  Date/Time:  Wednesday June 03 2020 08:40:42 EDT Ventricular Rate:  42 PR Interval:  190 QRS Duration: 89 QT Interval:  420 QTC Calculation: 351 R Axis:   34 Text Interpretation: Sinus bradycardia Atrial premature complex Low voltage, extremity and precordial leads No prior ECG for comparison. No STEMI Confirmed by Theda Belfast ([id number]) on 06/03/2020 8:48:24 AM   Radiology CT ABDOMEN PELVIS  W CONTRAST  Result Date: 06/03/2020 CLINICAL DATA:  Acute right-sided abdominal pain. EXAM: CT ABDOMEN AND PELVIS WITH CONTRAST TECHNIQUE: Multidetector CT imaging of the abdomen and pelvis was performed using the standard protocol following bolus administration of intravenous contrast. CONTRAST:  11mL OMNIPAQUE IOHEXOL 300 MG/ML  SOLN COMPARISON:  None. FINDINGS: Lower chest: No acute abnormality. Hepatobiliary: No focal liver abnormality is seen. No gallstones, gallbladder wall thickening, or biliary dilatation. Pancreas: Unremarkable. No pancreatic ductal dilatation or surrounding inflammatory changes. Spleen: Normal in size without focal abnormality. Adrenals/Urinary Tract: Adrenal glands appear normal. Mild right hydroureteronephrosis is noted with perinephric stranding secondary to 4 mm calculus at the right ureterovesical junction. Urinary bladder is otherwise unremarkable. Left kidney and ureter are unremarkable. Stomach/Bowel: 4.0 x 2.8 cm lobular density is noted posteriorly in the stomach which demonstrates some possible enhancement. This is concerning for possible mass. There is no evidence of bowel obstruction or inflammation. The appendix appears normal. Sigmoid diverticulosis is noted without  inflammation. Vascular/Lymphatic: Aortic atherosclerosis. No enlarged abdominal or pelvic lymph nodes. Reproductive: Prostate is unremarkable. Other: No abdominal wall hernia or abnormality. No abdominopelvic ascites. Musculoskeletal: No acute or significant osseous findings. IMPRESSION: Mild right hydroureteronephrosis is noted with perinephric stranding secondary to 4 mm calculus at the right ureterovesical junction. 4.0 x 2.8 cm lobular density is noted posteriorly in the stomach which may simply represent residual food debris, but there is the suggestion of some degree of enhancement of this abnormality, raising the possibility of mass or neoplasm. Endoscopy is recommended for further evaluation. Aortic Atherosclerosis (ICD10-I70.0). Electronically Signed   By: Lupita Raider M.D.   On: 06/03/2020 10:13    Procedures Procedures   Medications Ordered in ED Medications  ondansetron (ZOFRAN) injection 4 mg (4 mg Intravenous Given 06/03/20 0904)  morphine 4 MG/ML injection 4 mg (4 mg Intravenous Given 06/03/20 0904)  sodium chloride 0.9 % bolus 1,000 mL (1,000 mLs Intravenous New Bag/Given 06/03/20 0935)  iohexol (OMNIPAQUE) 300 MG/ML solution 80 mL (80 mLs Intravenous Contrast Given 06/03/20 3532)    ED Course  I have reviewed the triage vital signs and the nursing notes.  Pertinent labs & imaging results that were available during my care of the patient were reviewed by me and considered in my medical decision making (see chart for details).    MDM Rules/Calculators/A&P                          Nimesh Riolo is a 80 y.o. male with a past medical history significant for hypertension, gout, and reported prior kidney stone who presents with severe right-sided abdominal pain with nausea and vomiting this morning.  Patient reports that he was feeling well up until 4 AM this morning when he woke up with severe pain in his right abdomen.  He reports it is primarily right lower quadrant but also goes  somewhat in the right upper quadrant.  He had nausea and vomiting with dry heaving.  He reported had a normal bowel movement this morning and is passing gas well.  Denies any constipation or diarrhea.  No blood in the stool reported.  No urinary changes.  He denies any fevers or chills but did have some diaphoresis with the pain when it was severe this morning.  Denies any pain in the back.  Denies any trauma.  Denies any URI symptoms such as congestion, cough, chest pain, shortness of breath.  Reports the pain is 7 out of  10 and aching currently but it was at a 10 out of 10 at its worst.  Denies any radiation to the groin or testicles and denies any other groin symptoms.  Denies any rashes.  Denies any history of this.  Denies history of abdominal surgeries.  On exam, lungs clear chest nontender.  Abdomen is tender in the right lower quadrant and right upper quadrant.  Bowel sounds were appreciated.  No rash to suggest shingles.  No CVA tenderness or midline back tenderness.  No muscle spasm appreciated.  Good pulses in all extremities with normal sensation and strength in lower extremities.  Minimal edema seen in the legs.  Given patient's description of discomfort, I am primarily worried about ruling out appendicitis, diverticulitis on the right side, cholecystitis, kidney stone, or other cause of symptoms in the right abdomen.  His blood pressure on arrival here was in the 130s, low suspicion for aortic dissection given his lack of radiation to the back, lack of sharp pain, and lack of severe blood pressure elevation on arrival here.  He also denied any numbness, weakness in the legs and had good pulses in bilateral lower extremities.  We we will get a CT scan of the abdomen and pelvis to further evaluate as well as screening labs urinalysis.  We will give him pain medicine, nausea medicine, and make him n.p.o.  We will get a COVID test in case he needs to have a surgical procedure done today to expedite  care.  Anticipate reassessment after work-up.  11:00 AM CT scan is returned showing a kidney stone is a likely cause of his right-sided discomfort.  No evidence of appendicitis or diverticulitis.  No obstruction.  Patient does have an abnormal appearing area on his stomach which we discussed which could either be residual food versus a mass.  Patient will call his PCP to discuss outpatient follow-up and possible endoscopy.  Patient is awaiting urinalysis to see if there is an infection but if there is not, dissipate discharge home.  Patient reports he is already feeling much better after medications and fluids.  12:10 PM Urinalysis shows no nitrites, given lack of urinary symptoms, doubt infected stone or UTI at this time.  Patient will be given return for pain medicine and nausea medicine and will follow-up with his urologist.  He reports he still has Flomax to use at home.  He had no other questions or concerns and will also follow-up with his PCP in regards to the TSH that is still in process as well as the abnormality on stomach imaging.  Patient agrees with plan of care and will be discharged.   Final Clinical Impression(s) / ED Diagnoses Final diagnoses:  Non-intractable vomiting with nausea, unspecified vomiting type  RLQ abdominal pain  Right kidney stone    Rx / DC Orders ED Discharge Orders         Ordered    oxyCODONE-acetaminophen (PERCOCET/ROXICET) 5-325 MG tablet  Every 4 hours PRN        06/03/20 1212    ondansetron (ZOFRAN) 4 MG tablet  Every 8 hours PRN        06/03/20 1212          Clinical Impression: 1. Right kidney stone   2. Non-intractable vomiting with nausea, unspecified vomiting type   3. RLQ abdominal pain     Disposition: Discharge  Condition: Good  I have discussed the results, Dx and Tx plan with the pt(& family if present). He/she/they expressed understanding  and agree(s) with the plan. Discharge instructions discussed at great length. Strict  return precautions discussed and pt &/or family have verbalized understanding of the instructions. No further questions at time of discharge.    New Prescriptions   ONDANSETRON (ZOFRAN) 4 MG TABLET    Take 1 tablet (4 mg total) by mouth every 8 (eight) hours as needed for nausea or vomiting.   OXYCODONE-ACETAMINOPHEN (PERCOCET/ROXICET) 5-325 MG TABLET    Take 1 tablet by mouth every 4 (four) hours as needed for severe pain.    Follow Up: [facility] [address] [place] [id number] [phone]    Eartha InchBadger, Michael C, MD [address] [place] [id number] [phone]     [facility] [address] [place] [id number] [phone]       Elleanna Melling, Canary Brimhristopher J, MD 06/03/20 1213

## 2020-06-05 ENCOUNTER — Telehealth (HOSPITAL_BASED_OUTPATIENT_CLINIC_OR_DEPARTMENT_OTHER): Payer: Self-pay | Admitting: *Deleted

## 2020-06-06 LAB — URINE CULTURE: Culture: NO GROWTH

## 2020-08-13 ENCOUNTER — Encounter: Payer: Self-pay | Admitting: Internal Medicine

## 2020-08-13 ENCOUNTER — Ambulatory Visit (INDEPENDENT_AMBULATORY_CARE_PROVIDER_SITE_OTHER): Payer: Medicare HMO | Admitting: Internal Medicine

## 2020-08-13 ENCOUNTER — Other Ambulatory Visit: Payer: Self-pay

## 2020-08-13 VITALS — BP 134/44 | HR 57 | Temp 98.0°F | Ht 66.0 in | Wt 246.7 lb

## 2020-08-13 DIAGNOSIS — I10 Essential (primary) hypertension: Secondary | ICD-10-CM

## 2020-08-13 DIAGNOSIS — I872 Venous insufficiency (chronic) (peripheral): Secondary | ICD-10-CM | POA: Diagnosis not present

## 2020-08-13 DIAGNOSIS — N401 Enlarged prostate with lower urinary tract symptoms: Secondary | ICD-10-CM | POA: Diagnosis not present

## 2020-08-13 DIAGNOSIS — B356 Tinea cruris: Secondary | ICD-10-CM

## 2020-08-13 DIAGNOSIS — R351 Nocturia: Secondary | ICD-10-CM

## 2020-08-13 DIAGNOSIS — Z9109 Other allergy status, other than to drugs and biological substances: Secondary | ICD-10-CM

## 2020-08-13 MED ORDER — LISINOPRIL 20 MG PO TABS: 20.0000 mg | ORAL_TABLET | Freq: Every day | ORAL | 3 refills | Status: AC

## 2020-08-13 MED ORDER — MONTELUKAST SODIUM 10 MG PO TABS: 10.0000 mg | ORAL_TABLET | Freq: Every day | ORAL | 3 refills | Status: AC

## 2020-08-13 MED ORDER — TAMSULOSIN HCL 0.4 MG PO CAPS: ORAL_CAPSULE | ORAL | 3 refills | Status: AC

## 2020-08-13 MED ORDER — CICLOPIROX OLAMINE 0.77 % EX CREA: TOPICAL_CREAM | Freq: Two times a day (BID) | CUTANEOUS | 1 refills | Status: AC

## 2020-08-13 NOTE — Progress Notes (Signed)
Subjective:  HPI: [name] is a 80 y.o. male who presents to establish care for hypertension.  Please see Assessment and Plan below for the status of his chronic medical problems. 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   Social Determinants of Health   Financial Resource Strain: Not on file  Food Insecurity: Not on file  Transportation Needs: Not on file  Physical Activity: Not on file  Stress: Not on file  Social Connections: Not on file    Objective:  Physical Exam: Vitals:   08/13/20 0953  BP: (!) 134/44  Pulse: (!) 57  Temp: 98 F (36.7 C)  TempSrc: Oral  SpO2: 98%  Weight: 246 lb 11.2 oz (111.9 kg)  Height: 5\' 6"  (1.676 m)   Body mass index is 39.82 kg/m. Physical Exam Vitals and nursing note reviewed.  Constitutional:      Appearance: Normal appearance. He is not ill-appearing.  Cardiovascular:     Rate and Rhythm: Normal rate and regular rhythm.     Heart sounds: Normal heart sounds. No murmur heard. Pulmonary:     Effort: Pulmonary effort is  normal.     Breath sounds: Normal breath sounds.  Abdominal:     Palpations: Abdomen is soft.  Musculoskeletal:     Right lower leg: Edema present.     Left lower leg: No edema.  Neurological:     Mental Status: He is alert.  Psychiatric:        Mood and Affect: Mood normal.        Behavior: Behavior normal.        Judgment: Judgment normal.   Assessment & Plan:  See Encounters Tab for problem based charting.  Medications Ordered Meds ordered this encounter  Medications   ciclopirox (LOPROX) 0.77 % cream    Sig: Apply topically 2 (two) times daily.    Dispense:  15 g    Refill:  1   tamsulosin (FLOMAX) 0.4 MG CAPS capsule    Sig: Take 1 capsule daily    Dispense:  90 capsule    Refill:  3   montelukast (SINGULAIR) 10 MG tablet    Sig: Take 1 tablet (10 mg total) by mouth at bedtime.    Dispense:  90 tablet    Refill:  3   lisinopril (ZESTRIL) 20 MG tablet    Sig: Take 1 tablet (20 mg total) by mouth daily.  Dispense:  90 tablet    Refill:  3   Other Orders No orders of the defined types were placed in this encounter.  Follow Up: Return in about 6 months (around 02/13/2021).

## 2020-08-13 NOTE — Patient Instructions (Addendum)
Start wearing a compression stocking on your right leg to see if that helps more with the swelling.  Alternatively consider buying JUXTALITE leggin which can be easier to put on.  I placed a new cream to try to the Jock Itch.  Call me if you have any issues/

## 2020-08-14 ENCOUNTER — Encounter: Payer: Self-pay | Admitting: Internal Medicine

## 2020-08-14 NOTE — Assessment & Plan Note (Signed)
HPI: He reports increased swelling in his right lower extremity that has been going on for at least several months.  He does have a history of this and he reports that over 10 years ago he had a vascular intervention which helped the swelling.  He notes that the swelling is worse at the end of the day and it gradually progresses especially days where he is on his feet more.  Besides the swelling he does note some itching which he attributes to the skin being tighter.  Assessment venous insufficiency of right leg  Plan Discussed and offered referral back to vascular surgery for potential options plan discussed likely the main intervention is leg elevation and compression therapy.  He has good pedal pulses.  I did suggest he could try initially over-the-counter compression or be happy to order him some medical grade compression stockings.  Alternatively the juxta lite stocking would likely be easiest for him to get on.

## 2020-08-14 NOTE — Assessment & Plan Note (Signed)
HPI: He reports that his blood pressure has been well controlled on lisinopril 20 mg daily.  He has not had any issues with this medication and has tolerated it well.  Assessment essential hypertension well-controlled  Plan Lisinopril 20 mg daily.

## 2020-08-14 NOTE — Assessment & Plan Note (Signed)
HPI: He reports that he has "jock itch" he has been trying nystatin cream to the area without much improvement.  Wondering if he can do something else.  In review he has tried Lotrimin and Lamisil over-the-counter and these of also been ineffective.  Assessment tinea cruris  Plan cicloporox BID

## 2020-08-14 NOTE — Assessment & Plan Note (Signed)
HPI: On review of his medications he does take Flomax daily.  On review of his chart from his primary care physician he appears to have a diagnosis of BPH.  He does note some intermittent nocturia usually about 1-2 times per night despite taking the Flomax.  Assessment BPH  Plan Continue Flomax 0.4 mg daily advised if symptoms become more bothersome we can discuss alternative/adjunctive therapy

## 2020-08-14 NOTE — Assessment & Plan Note (Addendum)
Well-controlled using seasonal singulair.

## 2020-10-30 ENCOUNTER — Other Ambulatory Visit: Payer: Self-pay | Admitting: Internal Medicine

## 2020-10-30 DIAGNOSIS — R7989 Other specified abnormal findings of blood chemistry: Secondary | ICD-10-CM

## 2020-10-30 NOTE — Progress Notes (Signed)
Interested in [place] for weight loss. Will need to update labs.  Instructed to call for lab only visit need week.

## 2020-11-03 ENCOUNTER — Other Ambulatory Visit (INDEPENDENT_AMBULATORY_CARE_PROVIDER_SITE_OTHER): Payer: Medicare HMO

## 2020-11-03 DIAGNOSIS — R7989 Other specified abnormal findings of blood chemistry: Secondary | ICD-10-CM

## 2020-11-04 LAB — TSH: TSH: 5.63 u[IU]/mL — ABNORMAL HIGH (ref 0.450–4.500)

## 2020-11-04 LAB — CMP14 + ANION GAP
ALT: 14 IU/L (ref 0–44)
AST: 14 IU/L (ref 0–40)
Albumin/Globulin Ratio: 1.8 (ref 1.2–2.2)
Albumin: 4.2 g/dL (ref 3.7–4.7)
Alkaline Phosphatase: 59 IU/L (ref 44–121)
Anion Gap: 15 mmol/L (ref 10.0–18.0)
BUN/Creatinine Ratio: 16 (ref 10–24)
BUN: 22 mg/dL (ref 8–27)
Bilirubin Total: 0.3 mg/dL (ref 0.0–1.2)
CO2: 21 mmol/L (ref 20–29)
Calcium: 9.4 mg/dL (ref 8.6–10.2)
Chloride: 104 mmol/L (ref 96–106)
Creatinine, Ser: 1.35 mg/dL — ABNORMAL HIGH (ref 0.76–1.27)
Globulin, Total: 2.4 g/dL (ref 1.5–4.5)
Glucose: 110 mg/dL — ABNORMAL HIGH (ref 70–99)
Potassium: 4.5 mmol/L (ref 3.5–5.2)
Sodium: 140 mmol/L (ref 134–144)
Total Protein: 6.6 g/dL (ref 6.0–8.5)
eGFR: 54 mL/min/{1.73_m2} — ABNORMAL LOW (ref >59)

## 2020-11-04 LAB — HEMOGLOBIN A1C
Est. average glucose Bld gHb Est-mCnc: 126 mg/dL
Hgb A1c MFr Bld: 6 % — ABNORMAL HIGH (ref 4.8–5.6)

## 2020-11-04 LAB — T4, FREE: Free T4: 0.91 ng/dL (ref 0.82–1.77)

## 2020-11-04 LAB — T3: T3, Total: 112 ng/dL (ref 71–180)

## 2020-11-05 MED ORDER — OZEMPIC (0.25 OR 0.5 MG/DOSE) 2 MG/1.5ML ~~LOC~~ SOPN: PEN_INJECTOR | SUBCUTANEOUS | 2 refills | Status: AC

## 2020-11-05 NOTE — Progress Notes (Signed)
Called and reviewed lab results.   Thyroid studies show subclinical hypothyroidism>> continue to monitor. A1c indicative of prediabetes.  Given BMI of 39 and HTN he is interested in medication to assist with weight loss and reverse prediabetes.  His wife has recently started injection semaglutide and he would also like to start.  I have reviewed the medication with him and will send in Rx.

## 2020-11-05 NOTE — Addendum Note (Signed)
Addended by: Carlynn Purl C on: 11/05/2020 10:35 AM   Modules accepted: Orders

## 2020-11-23 ENCOUNTER — Encounter (HOSPITAL_COMMUNITY): Payer: Self-pay | Admitting: *Deleted

## 2020-11-23 ENCOUNTER — Other Ambulatory Visit: Payer: Self-pay

## 2020-11-23 ENCOUNTER — Ambulatory Visit (HOSPITAL_COMMUNITY)
Admission: EM | Admit: 2020-11-23 | Discharge: 2020-11-23 | Disposition: A | Discharge status: Home / Self Care | Payer: Medicare HMO | Attending: Emergency Medicine | Admitting: Emergency Medicine

## 2020-11-23 DIAGNOSIS — M109 Gout, unspecified: Secondary | ICD-10-CM | POA: Diagnosis not present

## 2020-11-23 MED ORDER — COLCHICINE 0.6 MG PO TABS: ORAL_TABLET | ORAL | 0 refills | Status: AC

## 2020-11-23 NOTE — ED Triage Notes (Signed)
T reports Lt great toe pain that started Sunday.

## 2020-11-23 NOTE — ED Provider Notes (Signed)
[facility]    CSN: [id number] Arrival date & time: 11/23/20  0941      History   Chief Complaint Chief Complaint  Patient presents with   Toe Pain    HPI Eric Navarro is a 80 y.o. male. Pt reports a history of gout. Last flare 3 or 4 years ago.  Flares typically relieved by colchicine.  Patient has pain in his left great toe started yesterday.  Reports this feels just like prior gout flares, requests prescription for colchicine.  Denies toe injury.   Toe Pain   Past Medical History:  Diagnosis Date   Gout    Hypertension    Renal disorder    kidney stone    Patient Active Problem List   Diagnosis Date Noted   Venous insufficiency of right leg 08/13/2020   Tinea cruris 08/13/2020   Severe obesity (BMI 35.0-39.9) with comorbidity (HCC) 02/19/2018   Benign prostatic hyperplasia 02/16/2017   Environmental allergies 02/16/2017   Hypertension, essential 02/16/2017    Past Surgical History:  Procedure Laterality Date   VASCULAR SURGERY Right 2010   venous insufficency right leg       Home Medications    Prior to Admission medications   Medication Sig Start Date End Date Taking? Authorizing Provider  colchicine 0.6 MG tablet Take 1.2mg  (2 pills) once today followed by 0.6mg  (1 pill daily until gout pain is gone) 11/23/20  Yes Nikisha Fleece, Marzella Schlein, NP  Cholecalciferol 25 MCG (1000 UT) tablet Take 1,000 Units by mouth daily.    [provider]  ciclopirox (LOPROX) 0.77 % cream Apply topically 2 (two) times daily. 08/13/20   Gust Rung, DO  lisinopril (ZESTRIL) 20 MG tablet Take 1 tablet (20 mg total) by mouth daily. 08/13/20   Gust Rung, DO  montelukast (SINGULAIR) 10 MG tablet Take 1 tablet (10 mg total) by mouth at bedtime. 08/13/20   Gust Rung, DO  Multiple Vitamin (MULTIVITAMIN WITH MINERALS) TABS tablet Take 1 tablet by mouth daily.    [provider]  Multiple Vitamins-Minerals (ICAPS AREDS 2 PO) Take 1 capsule by mouth 2  (two) times daily.    [provider]  Omega-3 Fatty Acids (FISH OIL) 1360 MG CAPS Take by mouth.    [provider]  Semaglutide,0.25 or 0.5MG /DOS, (OZEMPIC, 0.25 OR 0.5 MG/DOSE,) 2 MG/1.5ML SOPN Inject 0.25 mg into the skin once a week for 28 days, THEN 0.5 mg once a week for 28 days. 11/05/20 12/31/20  Gust Rung, DO  tamsulosin (FLOMAX) 0.4 MG CAPS capsule Take 1 capsule daily 08/13/20   Gust Rung, DO    Family History Family History  Family history unknown: Yes    Social History Social History   Tobacco Use   Smoking status: Never   Smokeless tobacco: Never  Substance Use Topics   Alcohol use: Not Currently   Drug use: Never     Allergies   [place] nut (berthollefia excelsa) skin test   Review of Systems Review of Systems   Physical Exam Triage Vital Signs ED Triage Vitals  Enc Vitals Group     BP 11/23/20 1210 138/78     Pulse Rate 11/23/20 1210 86     Resp 11/23/20 1210 20     Temp 11/23/20 1210 98.3 F (36.8 C)     Temp src --      SpO2 11/23/20 1210 99 %     Weight --      Height --  Head Circumference --      Peak Flow --      Pain Score 11/23/20 1208 10     Pain Loc --      Pain Edu? --      Excl. in GC? --    No data found.  Updated Vital Signs BP 138/78   Pulse 86   Temp 98.3 F (36.8 C)   Resp 20   SpO2 99%   Visual Acuity Right Eye Distance:   Left Eye Distance:   Bilateral Distance:    Right Eye Near:   Left Eye Near:    Bilateral Near:     Physical Exam Constitutional:      Appearance: Normal appearance. He is obese.  Pulmonary:     Effort: Pulmonary effort is normal.  Musculoskeletal:     Comments: Left great toe with edema, erythema, warmth, and exquisite tenderness to touch.  Left foot also with edema.  DP pulse intact.  Neurological:     Mental Status: He is alert.     UC Treatments / Results  Labs (all labs ordered are listed, but only abnormal results are displayed) Labs Reviewed -  No data to display  EKG   Radiology No results found.  Procedures Procedures (including critical care time)  Medications Ordered in UC Medications - No data to display  Initial Impression / Assessment and Plan / UC Course  I have reviewed the triage vital signs and the nursing notes.  Pertinent labs & imaging results that were available during my care of the patient were reviewed by me and considered in my medical decision making (see chart for details).  History and exam consistent with gout.  Prescribed colchicine as requested by patient.   Final Clinical Impressions(s) / UC Diagnoses   Final diagnoses:  Acute gout involving toe of left foot, unspecified cause   Discharge Instructions   None    ED Prescriptions     Medication Sig Dispense Auth. Provider   colchicine 0.6 MG tablet Take 1.2mg  (2 pills) once today followed by 0.6mg  (1 pill daily until gout pain is gone) 11 tablet Sweta Halseth, Marzella Schlein, NP      PDMP not reviewed this encounter.   Cathlyn Parsons, NP 11/23/20 1226

## 2020-11-25 ENCOUNTER — Ambulatory Visit (INDEPENDENT_AMBULATORY_CARE_PROVIDER_SITE_OTHER): Payer: Medicare HMO

## 2020-11-25 DIAGNOSIS — Z23 Encounter for immunization: Secondary | ICD-10-CM

## 2020-12-02 ENCOUNTER — Telehealth: Payer: Self-pay

## 2020-12-02 NOTE — Telephone Encounter (Signed)
RTC to [facility] [place].  She wants to inform PCP that patient has not started using the Ozempic yet.  He told her he "had reservations about using it and wanted to do his own research."  Per chart review, RN does not see where patient has been dx'd w/ DM, Ozempic looks like it was prescribed for weight loss.  Patient has his 6 month routine f/u with PCP scheduled for 03/11/21.  Will forward to Dr. Arbutus Ped, RN,BSN

## 2020-12-02 NOTE — Telephone Encounter (Signed)
Haley with [facility] Coach want to informed the doctor,pt is not taking Semaglutide,0.25 or 0.5MG /DOS, (OZEMPIC, 0.25 OR 0.5 MG/DOSE,) 2 MG/1.5ML SOPN. Please call back.

## 2020-12-07 NOTE — Telephone Encounter (Signed)
I gave a quick call to Eric Navarro, he had been reading some of the negative side effects, discussed the MOA of GLP1-ra and that nausea and GI upset can happen, to reduce this we try to start at a low dose and gradually titrate up.  Overall safety of Semaglitide is excellent and that it is difficult to accurately anticipate what kind of side effects he may have.

## 2020-12-16 ENCOUNTER — Encounter: Payer: Self-pay | Admitting: Internal Medicine

## 2020-12-16 DIAGNOSIS — E038 Other specified hypothyroidism: Secondary | ICD-10-CM | POA: Insufficient documentation

## 2021-03-08 ENCOUNTER — Emergency Department (HOSPITAL_COMMUNITY): Payer: Medicare HMO

## 2021-03-08 ENCOUNTER — Emergency Department (HOSPITAL_COMMUNITY)
Admission: EM | Admit: 2021-03-08 | Discharge: 2021-03-10 | Disposition: E | Payer: Medicare HMO | Attending: Emergency Medicine | Admitting: Emergency Medicine

## 2021-03-08 DIAGNOSIS — Z79899 Other long term (current) drug therapy: Secondary | ICD-10-CM | POA: Insufficient documentation

## 2021-03-08 DIAGNOSIS — I10 Essential (primary) hypertension: Secondary | ICD-10-CM | POA: Diagnosis not present

## 2021-03-08 DIAGNOSIS — I469 Cardiac arrest, cause unspecified: Secondary | ICD-10-CM | POA: Diagnosis not present

## 2021-03-08 DIAGNOSIS — R001 Bradycardia, unspecified: Secondary | ICD-10-CM | POA: Diagnosis present

## 2021-03-08 LAB — CBG MONITORING, ED: Glucose-Capillary: 127 mg/dL — ABNORMAL HIGH (ref 70–99)

## 2021-03-08 MED ORDER — EPINEPHRINE 1 MG/10ML IJ SOSY
PREFILLED_SYRINGE | INTRAMUSCULAR | Status: AC | PRN
  Administered 2021-03-08: 1 mg via INTRAVENOUS

## 2021-03-08 MED ORDER — SODIUM BICARBONATE 8.4 % IV SOLN
INTRAVENOUS | Status: AC | PRN
  Administered 2021-03-08 (×3): 100 meq via INTRAVENOUS

## 2021-03-08 MED ORDER — EPINEPHRINE 1 MG/10ML IJ SOSY
PREFILLED_SYRINGE | INTRAMUSCULAR | Status: AC | PRN
  Administered 2021-03-08 (×12): 1 mg via INTRAVENOUS

## 2021-03-08 MED ORDER — EPINEPHRINE HCL 5 MG/250ML IV SOLN IN NS
0.5000 ug/min | INTRAVENOUS | Status: DC
  Administered 2021-03-08: 10 ug/min via INTRAVENOUS

## 2021-03-08 MED ORDER — CALCIUM CHLORIDE 10 % IV SOLN
INTRAVENOUS | Status: AC | PRN
  Administered 2021-03-08 (×2): 1 g via INTRAVENOUS

## 2021-03-10 NOTE — ED Notes (Signed)
Family at bedside. 

## 2021-03-10 NOTE — ED Notes (Signed)
Help get patient on the monitor did ekg shown to Dr Kathrynn Humble

## 2021-03-10 NOTE — Code Documentation (Signed)
Pulses lost. 

## 2021-03-10 NOTE — ED Notes (Signed)
Patients wife and daughter state due to religious views someone must remain with the patient until he goes to the funeral home. Tim in Chemical engineer, Consulting civil engineer and bed placement notified.

## 2021-03-10 NOTE — ED Provider Notes (Signed)
[facility] EMERGENCY DEPARTMENT Provider Note   CSN: [id number] Arrival date & time: Mar 29, 2021  5284     History  Chief Complaint  Patient presents with   Bradycardia    Eric Navarro is a 81 y.o. male.  HPI     Pt comes in after being found unresponsive. LEVEL 5 CAVEAT FOR UNRESPONSIVE PATIENT.  Per EMS, wife found pt unresponsive and called 911. Pt had a faint pulse, irregular when they arrived. He was moaning upon arrival, but there was no purposeful movement. Then patient started having bradycardia, and they started pacing.  Per patient's wife, he had some URI-like symptoms about 2 weeks ago.  He recovered from them, but had lingering cough.  He had mentioned shortness of breath yesterday during dinner, stating that he feels at ease when he has napkin around his mouth.  There is also some exertional shortness of breath that either the family had witnessed or patient had complained about.  This morning he woke up, brushed his teeth, made coffee and then all of a sudden wife heard him collapse.  When she arrived patient was unresponsive.  Home Medications Prior to Admission medications   Medication Sig Start Date End Date Taking? Authorizing Provider  Cholecalciferol 25 MCG (1000 UT) tablet Take 1,000 Units by mouth daily.    [provider]  ciclopirox (LOPROX) 0.77 % cream Apply topically 2 (two) times daily. 08/13/20   Gust Rung, DO  colchicine 0.6 MG tablet Take 1.2mg  (2 pills) once today followed by 0.6mg  (1 pill daily until gout pain is gone) 11/23/20   Cathlyn Parsons, NP  lisinopril (ZESTRIL) 20 MG tablet Take 1 tablet (20 mg total) by mouth daily. 08/13/20   Gust Rung, DO  montelukast (SINGULAIR) 10 MG tablet Take 1 tablet (10 mg total) by mouth at bedtime. 08/13/20   Gust Rung, DO  Multiple Vitamin (MULTIVITAMIN WITH MINERALS) TABS tablet Take 1 tablet by mouth daily.    [provider]  Multiple Vitamins-Minerals (ICAPS  AREDS 2 PO) Take 1 capsule by mouth 2 (two) times daily.    [provider]  Omega-3 Fatty Acids (FISH OIL) 1360 MG CAPS Take by mouth.    [provider]  tamsulosin (FLOMAX) 0.4 MG CAPS capsule Take 1 capsule daily 08/13/20   Gust Rung, DO      Allergies    [place] nut (berthollefia excelsa) skin test    Review of Systems   Review of Systems  Physical Exam Updated Vital Signs BP (!) 91/44    Pulse 68    Resp (!) 24    Ht 5\' 6"  (1.676 m)    Wt 113.4 kg    SpO2 92%    BMI 40.35 kg/m  Physical Exam Vitals and nursing note reviewed.  Constitutional:      Appearance: He is well-developed.     Comments: Unresponsive, face appears dusky compared to rest of his body  Cardiovascular:     Comments: Getting paced  Musculoskeletal:     Cervical back: Neck supple.  Skin:    General: Skin is warm.  Neurological:     Comments: GCS 3    ED Results / Procedures / Treatments   Labs (all labs ordered are listed, but only abnormal results are displayed) Labs Reviewed  CBG MONITORING, ED - Abnormal; Notable for the following components:      Result Value   Glucose-Capillary 127 (*)    All other components  within normal limits  APTT  PROTIME-INR  COMPREHENSIVE METABOLIC PANEL  CBC WITH DIFFERENTIAL/PLATELET  BRAIN NATRIURETIC PEPTIDE  I-STAT ARTERIAL BLOOD GAS, ED  I-STAT CHEM 8, ED  TROPONIN I (HIGH SENSITIVITY)  TROPONIN I (HIGH SENSITIVITY)    EKG EKG Interpretation  Date/Time:  Monday 2021-03-17 09:00:12 EST Ventricular Rate:  40 PR Interval:    QRS Duration: 136 QT Interval:  351 QTC Calculation: 287 R Axis:   100 Text Interpretation: Junctional rhythm RBBB and LPFB Repol abnrm suggests ischemia, diffuse leads bradycardia is new nonspecific ST changes Confirmed by Derwood Kaplan [phone]) on 03/17/2021 10:00:37 AM  Radiology No results found.  Procedures .Critical Care Performed by: Derwood Kaplan, MD Authorized by: Derwood Kaplan, MD    Critical care provider statement:    Critical care time (minutes):  30   Critical care time was exclusive of:  Separately billable procedures and treating other patients   Critical care was necessary to treat or prevent imminent or life-threatening deterioration of the following conditions:  Circulatory failure, cardiac failure and respiratory failure   Critical care was time spent personally by me on the following activities:  Development of treatment plan with patient or surrogate, evaluation of patient's response to treatment, examination of patient, ordering and review of radiographic studies, ordering and performing treatments and interventions, pulse oximetry, re-evaluation of patient's condition, review of old charts, obtaining history from patient or surrogate and ordering and review of laboratory studies CPR  Date/Time: 17-Mar-2021 12:07 PM Performed by: Derwood Kaplan, MD Authorized by: Derwood Kaplan, MD  CPR Procedure Details:    CPR/ACLS performed in the ED: Yes     Duration of CPR (minutes):  60   Outcome: Pt declared dead    CPR performed via ACLS guidelines under my direct supervision.  See RN documentation for details including defibrillator use, medications, doses and timing. Procedure Name: Intubation Date/Time: 17-Mar-2021 12:07 PM Performed by: Derwood Kaplan, MD Pre-anesthesia Checklist: Patient identified, Patient being monitored, Emergency Drugs available, Timeout performed and Suction available Oxygen Delivery Method: Non-rebreather mask Preoxygenation: Pre-oxygenation with 100% oxygen Ventilation: Mask ventilation without difficulty Laryngoscope Size: Glidescope and 4 Grade View: Grade I Tube size: 8.0 mm Number of attempts: 1 Placement Confirmation: ETT inserted through vocal cords under direct vision, CO2 detector and Breath sounds checked- equal and bilateral Secured at: 25 cm Tube secured with: ETT holder    External pacer  Date/Time: 2021/03/17 12:08  PM Performed by: Derwood Kaplan, MD Authorized by: Derwood Kaplan, MD  Consent: The procedure was performed in an emergent situation. Required items: required blood products, implants, devices, and special equipment available Patient identity confirmed: arm band Local anesthesia used: no  Anesthesia: Local anesthesia used: no  Sedation: Patient sedated: no  Patient tolerance: patient tolerated the procedure well with no immediate complications      Medications Ordered in ED Medications  EPINEPHrine (ADRENALIN) 5 mg in NS 250 mL (0.02 mg/mL) premix infusion (0 mcg/min Intravenous Stopped 03/17/21 0940)  EPINEPHrine (ADRENALIN) 1 MG/10ML injection (1 mg Intravenous Given 03-17-2021 0937)  sodium bicarbonate injection (100 mEq Intravenous Given 17-Mar-2021 0933)  calcium chloride injection (1 g Intravenous Given 03-17-2021 0851)  EPINEPHrine (ADRENALIN) 1 MG/10ML injection (1 mg Intravenous Given 03/17/21 0932)    ED Course/ Medical Decision Making/ A&P                           Medical Decision Making Amount and/or Complexity of Data Reviewed Labs:  ordered. Radiology: ordered.  Risk Prescription drug management.   This patient presents to the ED with chief complaint(s) of unresponsiveness, found to be pulseless despite being paced during our pulse check with pertinent past medical history of hypertension, possible CKD which further complicates the presenting complaint. The complaint involves an extensive differential diagnosis and treatment options and also carries with it a high risk of complications and morbidity.    The differential diagnosis includes arrhythmia, ACS, PE, severe electrolyte abnormality such as hyperkalemia, brain bleed from trauma, vertebral dissection, ischemic stroke.  The initial plan is to initiate ACLS protocol and secure the airway.   Additional history obtained: Additional history obtained from spouse and EMS  Records reviewed Primary Care  Documents  Reassessment and review: After about 35 minutes of CPR, we were able to discuss the updates with the patient's wife and daughter.  They were made aware that Mr. Rolley SimsHampton is undergoing CPR and is on ventilatory support.  We indicated that we had not had return of spontaneous circulation.  Soon after, we had about 3 to 5 minutes of ROSC.  We started pacing the patient at that time transcutaneously, but he lost his pulse again, and CPR was initiated.  After about 15 minutes and several rounds of CPR, we had ROSC again.  Family brought to the bedside at that time.  Within a matter of few minutes again, Mr. Rolley SimsHampton started having bradycardia and ultimately lost his pulse.  CPR was initiated again.  In total patient received multiple rounds of epinephrine, 2 rounds of bicarb, calcium gluconate and IV fluids, with no sustained ROSC.  Even we did have return of spontaneous circulation, patient's heart rate was significantly low and he required pacing.  He was not breathing above the ventilator at any point in the GCS remained 3.  Patient also was hypotensive and was on 10-15 of epinephrine for circulatory support  We finally did pronounce him disease, after more than 1 hour of sustained efforts from our team to revive Mr. Levett.  Family was made aware, with chaplain and a rabbi with them.  I spoke with patient's PCP, Dr. Mikey BussingHoffman who has agreed to sign the death certificate.  Critical Interventions:  CPR/ACLS, Pacing, intubation  Cardiac Monitoring: The patient was maintained on a cardiac monitor.  I personally viewed and interpreted the cardiac monitor which showed an underlying rhythm of:   Junctional bradycardia, paced rhythm, wide complex rhythm with normal rate.  Complexity of problems addressed: Patients presentation is most consistent with: Acute events that led to patient's demise.  Disposition: Disease, discharged to funeral home.  Dr. Mikey BussingHoffman to sign the death  certificate.   Final Clinical Impression(s) / ED Diagnoses Final diagnoses:  Cardiac arrest [facility](HCC)    Rx / DC Orders ED Discharge Orders     None         Derwood KaplanNanavati, Raunel Dimartino, MD 2021-08-29 1209

## 2021-03-10 NOTE — ED Triage Notes (Signed)
Per [facility] pt coming from home wife called after witnessing a syncopal episode. Patient groaning on arrival for EMS. Non verbal en route. About 10 mins from hospital pt began to brady down and EMS started pacing. About 5 mins later pt had to be bagged. Patient gray and unresponsive on arrival.

## 2021-03-10 NOTE — Code Documentation (Signed)
Patient time of death occurred at [phone]

## 2021-03-10 NOTE — Code Documentation (Signed)
CBG 127 

## 2021-03-10 NOTE — ED Notes (Signed)
Funeral home here to take patient. Family at bedside.

## 2021-03-10 NOTE — Code Documentation (Signed)
Pulses lost. CPR resumed.  

## 2021-03-10 NOTE — Progress Notes (Signed)
°   April 04, 2021 0921  Clinical Encounter Type  Visited With Family;Health care provider;Other (Comment) Jake Bathe)  Visit Type ED;Code;Death;Initial  Referral From Physician  Consult/Referral To Chaplain  Spiritual Encounters  Spiritual Needs Grief support;Other (Comment) (Coordinate Rabbi)  Stress Factors  Patient Stress Factors Loss  Family Stress Factors Loss   Chaplain responded to page E.D. Trauma A. Medical Team actively treating for [place]. Patient's wife and daughter were in room during resuscitation efforts. Chaplain Escorted Family to [facility] Consultation Room-A. Coordinated with security to direct patient's Rabbi to [facility]. Spiritual Care was turned over to patient's Rabbi, Ethelene Hal, who prayed with the family.  Chaplain then met with E.D. Dr. Kathrynn Humble to get update on resuscitation efforts. Dr. Kathrynn Humble asked that I explain that patient was not responding to emergency treatment and survival was not likely due to systemic organ failure. I then met with patient's wife, daughter, and Jake Bathe to inquire if family and patient had previously discussed what patient would want for care in this situation. Wife and daughter concurred that patient would not want to be kept alive by artificial means if it were unlikely for him to regain consciousness. We discussed the quality of life that patient would experience should he be kept alive by artificial means with the extensive organ failure that had already occurred during resuscitation efforts. Wife and daughter both stated that patient would not want to be kept alive in that situation. Chaplain provided ministry of presence and grief support to patient's family.   Dr. Kathrynn Humble and P.A. Student entered the room to inform the family that the patient did not survive, and died in spite of Emergency Resuscitation efforts. Dr. Linwood Dibbles expressed his condolences to the family and answered their questions. Chaplain later conferred with  Dr. Kathrynn Humble and nursing staff regarding their individual grief needs.   Chaplain then escorted family and Rabbi back to TR-A to view body and pray in their [place]. Chaplain remained with patient and family to answer questions and coordinated the release of the body through Patient Placement, [employer], Quarry manager, and [place]. At 12:15 I met with M.E. and escorted he and [employer] representative to patient and introduced them to family.  Total time of 3.5 hours spent with family, Rabbi, Social research officer, government, and Quarry manager, [employer].coordinating the pick-up of the deceased patient. Chaplain escorted family back to E.D. entrance.

## 2021-03-10 DEATH — deceased

## 2021-03-11 ENCOUNTER — Encounter: Payer: Medicare HMO | Admitting: Internal Medicine

## 2021-07-26 IMAGING — CT CT ABD-PELV W/ CM
2 of 5 series · 16 of 46 positions shown, 18 images · IV contrast (omnipaque)
Comparison: None.

CLINICAL DATA: Acute right-sided abdominal pain.

EXAM:
CT ABDOMEN AND PELVIS WITH CONTRAST
TECHNIQUE: Multidetector CT imaging of the abdomen and pelvis was performed
using the standard protocol following bolus administration of
intravenous contrast.
CONTRAST:  80mL OMNIPAQUE IOHEXOL 300 MG/ML  SOLN

[Series 2: abd pel w · axial · 0.91mm/px · z∈[+1012,+1422]mm · 13 of 94 slices shown, 15 images]
[im 6/94  soft-tissue]
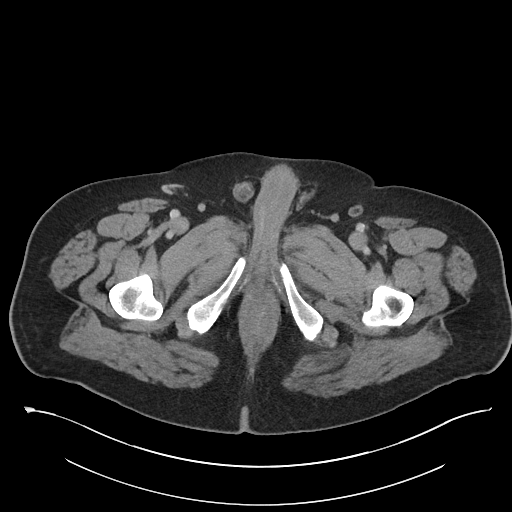
[im 6/94  bone]
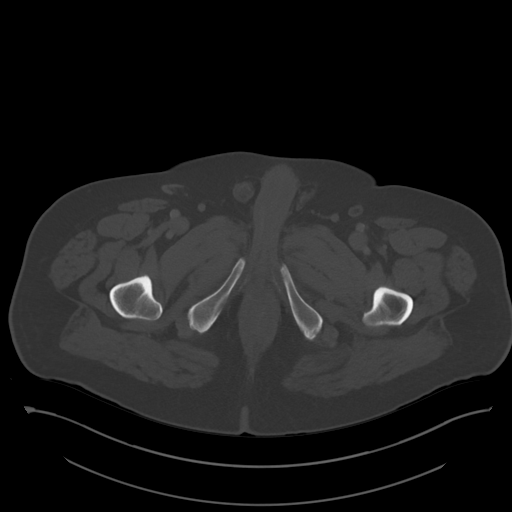
[im 11/94  soft-tissue]
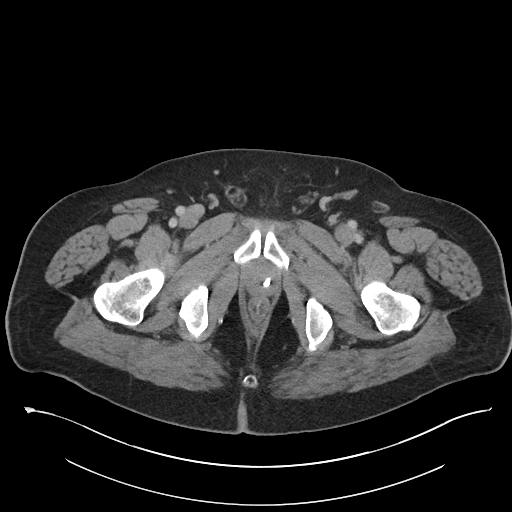
[im 21/94  soft-tissue]
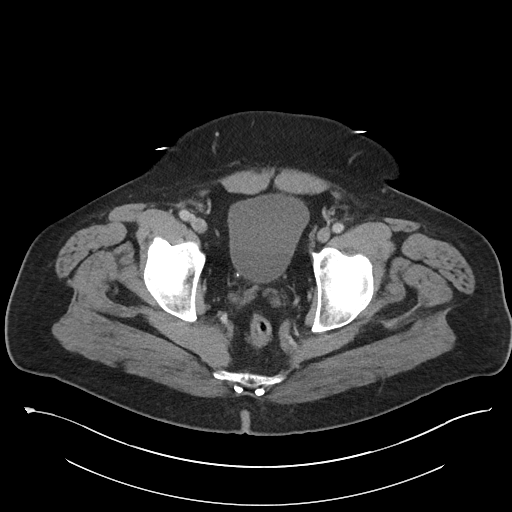
[im 26/94  soft-tissue]
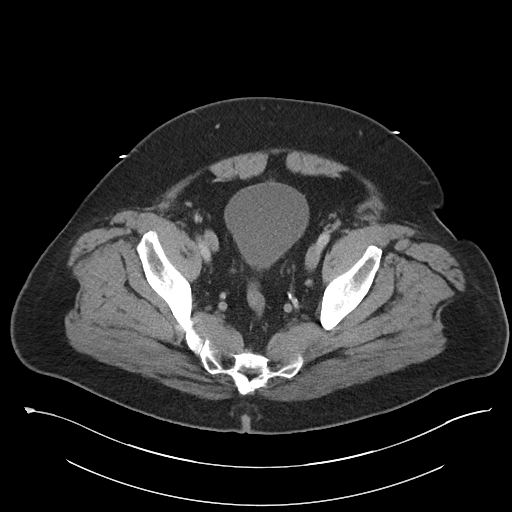
[im 32/94  soft-tissue]
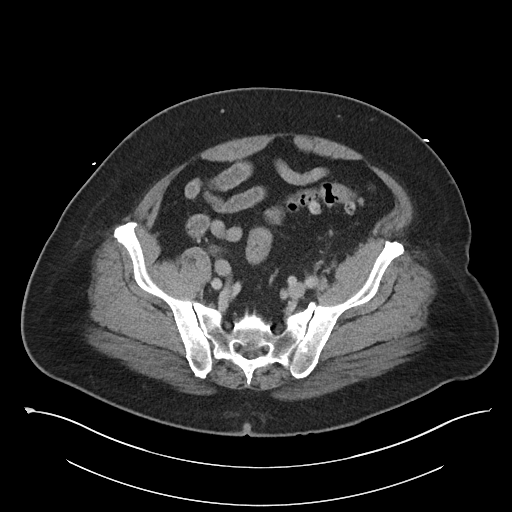
[im 42/94  soft-tissue]
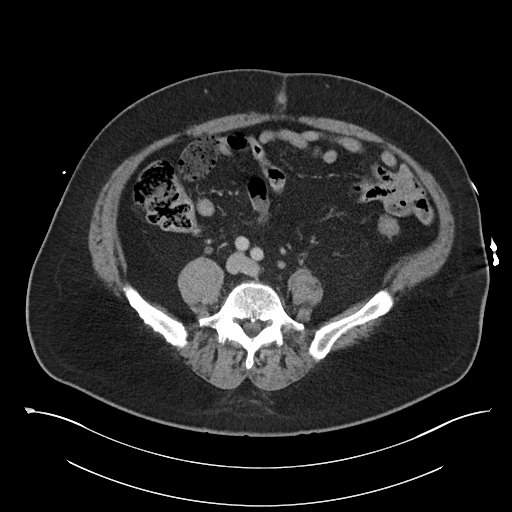
[im 47/94  soft-tissue]
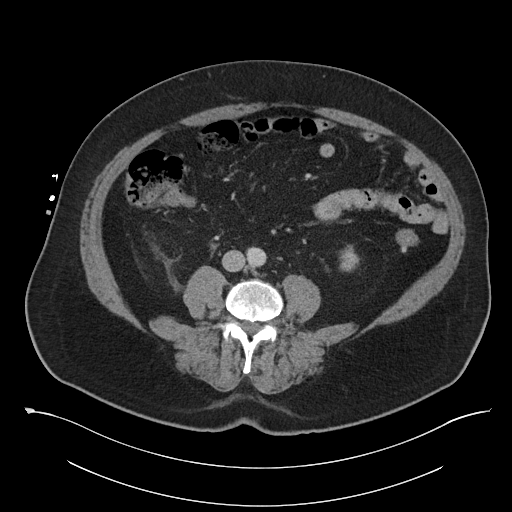
[im 52/94  soft-tissue]
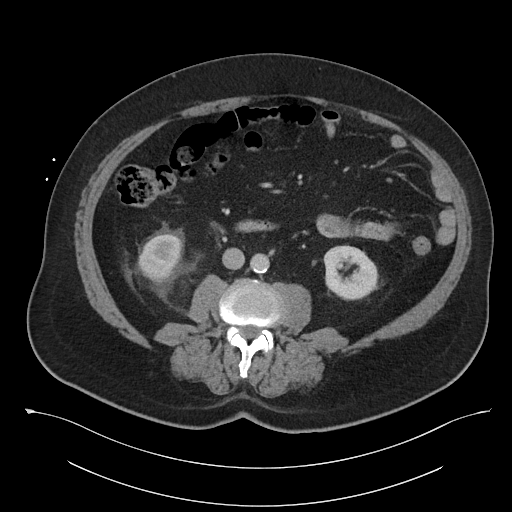
[im 63/94  soft-tissue]
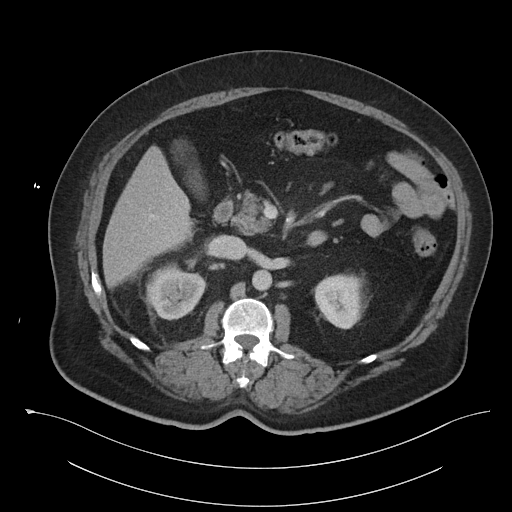
[im 63/94  bone]
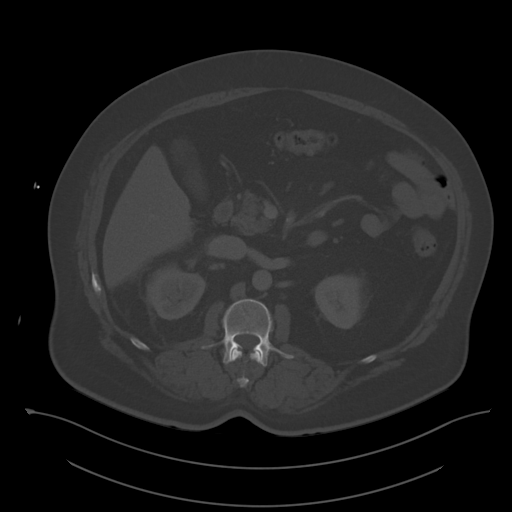
[im 68/94  soft-tissue]
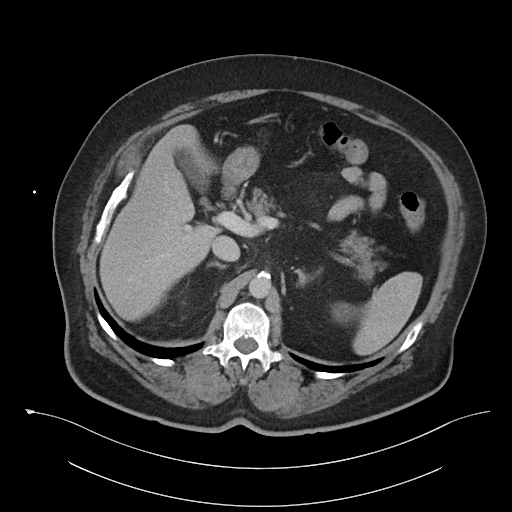
[im 73/94  soft-tissue]
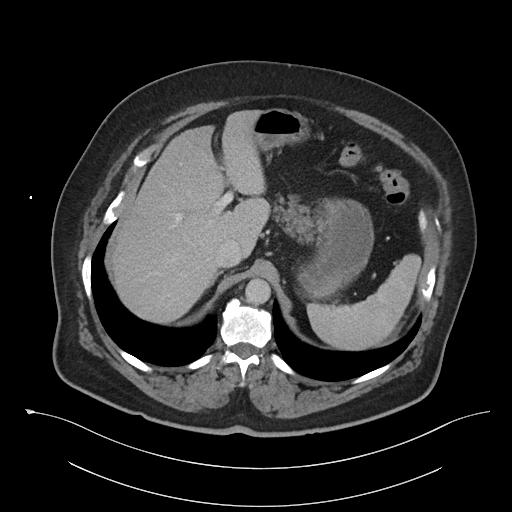
[im 83/94  soft-tissue]
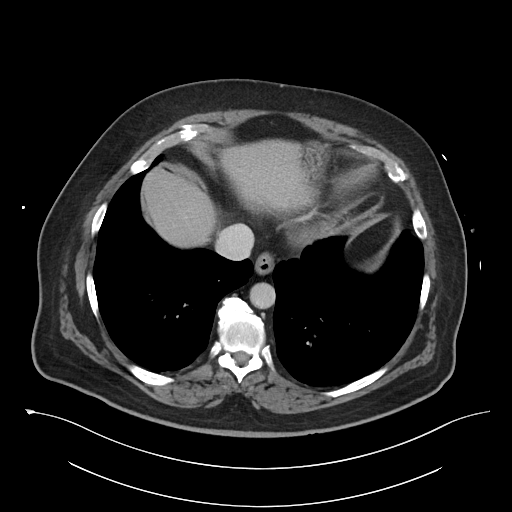
[im 88/94  soft-tissue]
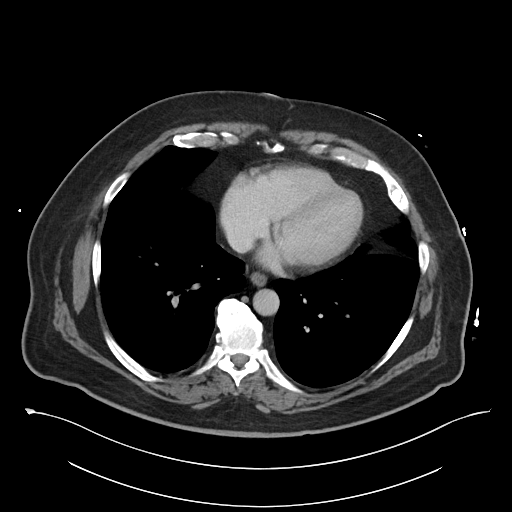

[Series 5: coronal · coronal · 0.90mm/px · 3 of 124 slices shown]
[im 42/124  soft-tissue]
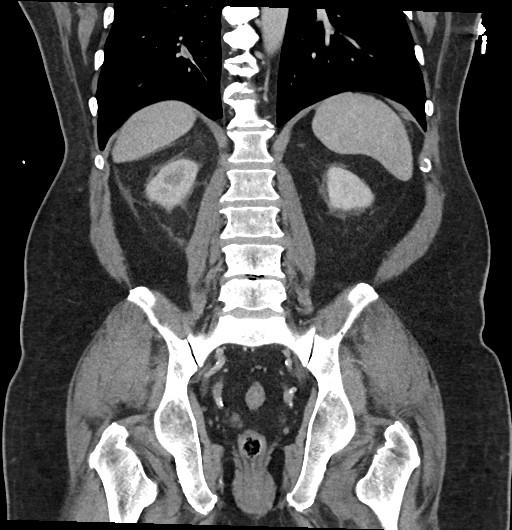
[im 55/124  soft-tissue]
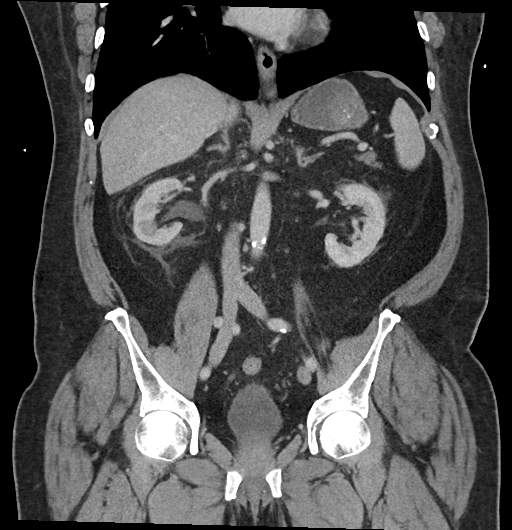
[im 69/124  soft-tissue]
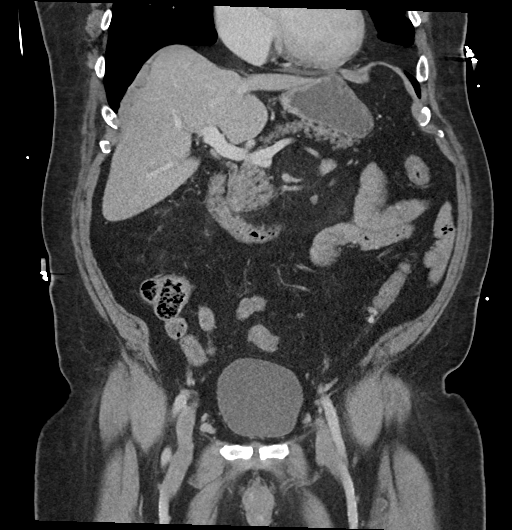

[16 of 46 positions shown; findings below may reference images not displayed]

FINDINGS: Lower chest: No acute abnormality.

Hepatobiliary: No focal liver abnormality is seen. No gallstones,
gallbladder wall thickening, or biliary dilatation.

Pancreas: Unremarkable. No pancreatic ductal dilatation or
surrounding inflammatory changes.

Spleen: Normal in size without focal abnormality.

Adrenals/Urinary Tract: Adrenal glands appear normal. Mild right
hydroureteronephrosis is noted with perinephric stranding secondary
to 4 mm calculus at the right ureterovesical junction. Urinary
bladder is otherwise unremarkable. Left kidney and ureter are
unremarkable.

Stomach/Bowel: 4.0 x 2.8 cm lobular density is noted posteriorly in
the stomach which demonstrates some possible enhancement. This is
concerning for possible mass. There is no evidence of bowel
obstruction or inflammation. The appendix appears normal. Sigmoid
diverticulosis is noted without inflammation.

Vascular/Lymphatic: Aortic atherosclerosis. No enlarged abdominal or
pelvic lymph nodes.

Reproductive: Prostate is unremarkable.

Other: No abdominal wall hernia or abnormality. No abdominopelvic
ascites.

Musculoskeletal: No acute or significant osseous findings.
IMPRESSION: Mild right hydroureteronephrosis is noted with perinephric stranding
secondary to 4 mm calculus at the right ureterovesical junction.

4.0 x 2.8 cm lobular density is noted posteriorly in the stomach
which may simply represent residual food debris, but there is the
suggestion of some degree of enhancement of this abnormality,
raising the possibility of mass or neoplasm. Endoscopy is
recommended for further evaluation.

Aortic Atherosclerosis ([id number]-[id number].I).
